# Patient Record
Sex: Female | Born: 1945 | Race: Black or African American | Hispanic: No | Smoking: Never smoker
Health system: Southern US, Community
[De-identification: ages and names within clinical notes are randomized; demographics above are authoritative.]

## PROBLEM LIST (undated history)

## (undated) DIAGNOSIS — M549 Dorsalgia, unspecified: Secondary | ICD-10-CM

## (undated) DIAGNOSIS — I10 Essential (primary) hypertension: Secondary | ICD-10-CM

## (undated) DIAGNOSIS — G8929 Other chronic pain: Secondary | ICD-10-CM

---

## 2011-06-11 ENCOUNTER — Encounter: Payer: Self-pay | Admitting: *Deleted

## 2011-06-11 ENCOUNTER — Emergency Department (HOSPITAL_COMMUNITY): Payer: No Typology Code available for payment source

## 2011-06-11 ENCOUNTER — Emergency Department (HOSPITAL_COMMUNITY)
Admission: EM | Admit: 2011-06-11 | Discharge: 2011-06-11 | Disposition: A | Discharge status: Home / Self Care | Payer: No Typology Code available for payment source | Attending: Emergency Medicine | Admitting: Emergency Medicine

## 2011-06-11 DIAGNOSIS — M545 Low back pain, unspecified: Secondary | ICD-10-CM | POA: Insufficient documentation

## 2011-06-11 DIAGNOSIS — M549 Dorsalgia, unspecified: Secondary | ICD-10-CM

## 2011-06-11 HISTORY — DX: Other chronic pain: G89.29

## 2011-06-11 HISTORY — DX: Essential (primary) hypertension: I10

## 2011-06-11 HISTORY — DX: Dorsalgia, unspecified: M54.9

## 2011-06-11 LAB — CBC
Hemoglobin: 11.6 g/dL — ABNORMAL LOW (ref 12.0–15.0)
Platelets: 259 10*3/uL (ref 150–400)
RBC: 4.21 MIL/uL (ref 3.87–5.11)
WBC: 8.2 10*3/uL (ref 4.0–10.5)

## 2011-06-11 LAB — DIFFERENTIAL
Eosinophils Absolute: 0 10*3/uL (ref 0.0–0.7)
Lymphocytes Relative: 20 % (ref 12–46)
Lymphs Abs: 1.6 10*3/uL (ref 0.7–4.0)
Neutro Abs: 5.8 10*3/uL (ref 1.7–7.7)
Neutrophils Relative %: 71 % (ref 43–77)

## 2011-06-11 LAB — BASIC METABOLIC PANEL
CO2: 26 mEq/L (ref 19–32)
Chloride: 104 mEq/L (ref 96–112)
GFR calc non Af Amer: 89 mL/min — ABNORMAL LOW (ref >90)
Glucose, Bld: 125 mg/dL — ABNORMAL HIGH (ref 70–99)
Potassium: 4.6 mEq/L (ref 3.5–5.1)
Sodium: 140 mEq/L (ref 135–145)

## 2011-06-11 MED ORDER — DIAZEPAM 5 MG PO TABS: 5.0000 mg | ORAL_TABLET | Freq: Four times a day (QID) | ORAL | Status: AC | PRN

## 2011-06-11 MED ORDER — METOCLOPRAMIDE HCL 10 MG PO TABS: 10.0000 mg | ORAL_TABLET | Freq: Four times a day (QID) | ORAL | Status: AC | PRN

## 2011-06-11 MED ORDER — ONDANSETRON 8 MG PO TBDP
8.0000 mg | ORAL_TABLET | Freq: Once | ORAL | Status: AC
  Administered 2011-06-11: 8 mg via ORAL
  Filled 2011-06-11: qty 1

## 2011-06-11 MED ORDER — HYDROMORPHONE HCL 4 MG PO TABS
4.0000 mg | ORAL_TABLET | ORAL | Status: AC | PRN
Start: 2011-06-11 — End: 2011-06-21

## 2011-06-11 MED ORDER — ONDANSETRON HCL 4 MG/2ML IJ SOLN
4.0000 mg | Freq: Once | INTRAMUSCULAR | Status: AC
  Administered 2011-06-11: 4 mg via INTRAVENOUS
  Filled 2011-06-11: qty 2

## 2011-06-11 MED ORDER — DIAZEPAM 5 MG/ML IJ SOLN
5.0000 mg | Freq: Once | INTRAMUSCULAR | Status: AC
  Administered 2011-06-11: 04:00:00 via INTRAMUSCULAR
  Filled 2011-06-11: qty 2

## 2011-06-11 MED ORDER — ONDANSETRON 8 MG PO TBDP: ORAL_TABLET | ORAL | Status: AC

## 2011-06-11 MED ORDER — HYDROMORPHONE HCL PF 1 MG/ML IJ SOLN
1.0000 mg | Freq: Once | INTRAMUSCULAR | Status: AC
  Administered 2011-06-11: 1 mg via INTRAVENOUS
  Filled 2011-06-11: qty 1

## 2011-06-11 NOTE — ED Notes (Signed)
Pt has chronic back spasms, pt presents tonight with c/o same.  Pt states she experiences pain upon sitting or standing.

## 2011-06-11 NOTE — ED Notes (Signed)
EDP in to reassess pt Informed pt of d/c to home.

## 2011-06-11 NOTE — ED Provider Notes (Signed)
History     CSN: [id number]  Arrival date & time 06/11/11  0224   First MD Initiated Contact with Patient 06/11/11 [phone]      Chief Complaint  Patient presents with  . Back Pain     Patient is a 66 y.o. female presenting with back pain. The history is provided by the patient and a relative.  Back Pain  This is a recurrent problem. Episode onset: today. The problem occurs constantly. The problem has been gradually worsening. The pain is associated with no known injury. The pain is present in the lumbar spine. Quality: spasm. The pain does not radiate. The pain is severe. The symptoms are aggravated by bending and twisting. The pain is the same all the time. Pertinent negatives include no fever and no paresis.    Past Medical History  Diagnosis Date  . Chronic back pain greater than 3 months duration   . Hypertension     History reviewed. No pertinent past surgical history.  History reviewed. No pertinent family history.  History  Substance Use Topics  . Smoking status: Not on file  . Smokeless tobacco: Not on file  . Alcohol Use:     OB History    Grav Para Term Preterm Abortions TAB SAB Ect Mult Living                  Review of Systems  Constitutional: Negative for fever.  Musculoskeletal: Positive for back pain.  All other systems reviewed and are negative.    Allergies  Amoxicillin  Home Medications   Current Outpatient Rx  Name Route Sig Dispense Refill  . ASPIRIN-ACETAMINOPHEN-CAFFEINE 250-250-65 MG PO TABS Oral Take 1 tablet by mouth every 6 (six) hours as needed.      . IBUPROFEN 200 MG PO TABS Oral Take 400 mg by mouth every 6 (six) hours as needed. Pain     . LISINOPRIL 10 MG PO TABS Oral Take 10 mg by mouth daily.       BP 146/84  Pulse 70  Temp(Src) 97.6 F (36.4 C) (Oral)  Resp 20  SpO2 99%  Physical Exam  CONSTITUTIONAL: Well developed/well nourished, anxious HEAD AND FACE: Normocephalic/atraumatic EYES: EOMI/PERRL ENMT: Mucous  membranes moist NECK: supple no meningeal signs SPINE:entire spine nontender, lumbar paraspinal tenderness, no bruising/crepitance noted CV: S1/S2 noted, no murmurs/rubs/gallops noted LUNGS: Lungs are clear to auscultation bilaterally, no apparent distress ABDOMEN: soft, nontender, no rebound or guarding GU:no cva tenderness NEURO: Pt is awake/alert, moves all extremitiesx4, no focal motor deficit EXTREMITIES: pulses normal, full ROM SKIN: warm, color normal PSYCH: anxious   ED Course  Procedures    Labs Reviewed  CBC  DIFFERENTIAL  BASIC METABOLIC PANEL  URINALYSIS, ROUTINE W REFLEX MICROSCOPIC   5:46 AM Pain not improving, will give IV meds and check labs Pt stable but still in pain  7:48 AM Pt did have improvement but with movement in bed this re-aggravated her pain No focal motor deficits in the lower extremities No abd pain She reports she had a distant back injury with work and this will recur  MDM  Nursing notes reviewed and considered in documentation All labs/vitals reviewed and considered         Joya Gaskins, MD 06/11/11 [phone]

## 2011-06-11 NOTE — ED Notes (Signed)
Patient transported to X-ray..returned to rm

## 2011-06-11 NOTE — ED Notes (Signed)
[id number]<AV> Expected date:<BR> Expected time:<BR> Means of arrival:<BR> Comments:<BR> EMS/muscle spasms lower back

## 2011-06-11 NOTE — ED Notes (Signed)
Pt requesting more pain meds before labs are drawn. Pt states she is unable to sit down on the bed due to pain

## 2011-06-11 NOTE — ED Provider Notes (Signed)
Patient offered admission for severe pain, but wants to try discharge.[id number]  Melanie Horn, MD 06/15/11 2241

## 2012-10-05 ENCOUNTER — Emergency Department (INDEPENDENT_AMBULATORY_CARE_PROVIDER_SITE_OTHER)
Admission: EM | Admit: 2012-10-05 | Discharge: 2012-10-05 | Disposition: A | Discharge status: Home / Self Care | Payer: Medicare Other | Source: Home / Self Care | Attending: Emergency Medicine | Admitting: Emergency Medicine

## 2012-10-05 ENCOUNTER — Encounter (HOSPITAL_COMMUNITY): Payer: Self-pay

## 2012-10-05 DIAGNOSIS — M5432 Sciatica, left side: Secondary | ICD-10-CM

## 2012-10-05 DIAGNOSIS — IMO0002 Reserved for concepts with insufficient information to code with codable children: Secondary | ICD-10-CM | POA: Diagnosis not present

## 2012-10-05 DIAGNOSIS — M5430 Sciatica, unspecified side: Secondary | ICD-10-CM

## 2012-10-05 DIAGNOSIS — M545 Low back pain: Secondary | ICD-10-CM | POA: Diagnosis not present

## 2012-10-05 MED ORDER — LISINOPRIL 10 MG PO TABS: 10.0000 mg | ORAL_TABLET | Freq: Every day | ORAL | Status: AC

## 2012-10-05 MED ORDER — IBUPROFEN 800 MG PO TABS: 800.0000 mg | ORAL_TABLET | Freq: Three times a day (TID) | ORAL | Status: DC

## 2012-10-05 MED ORDER — CYCLOBENZAPRINE HCL 5 MG PO TABS
5.0000 mg | ORAL_TABLET | Freq: Three times a day (TID) | ORAL | Status: DC | PRN
Start: 2012-10-05 — End: 2013-08-11

## 2012-10-05 MED ORDER — IBUPROFEN 800 MG PO TABS
ORAL_TABLET | ORAL | Status: AC
  Filled 2012-10-05: qty 1

## 2012-10-05 MED ORDER — IBUPROFEN 800 MG PO TABS
800.0000 mg | ORAL_TABLET | Freq: Once | ORAL | Status: AC
  Administered 2012-10-05: 800 mg via ORAL

## 2012-10-05 NOTE — ED Provider Notes (Signed)
Chief Complaint:   Chief Complaint  Patient presents with  . Back Pain    History of Present Illness:   Melanie Henderson is a 67 year old female who recently moved here from [place] and is living with her daughter. In the move she was doing a lot of lifting of furniture and boxes and injured her back. She has similar back pain about a year ago. Right now she describes pain across the entire lower back with radiation down the left leg into the foot with numbness, tingling, and weakness of the left leg. It hurts worse to bend over to lift and is better with rest. The pain is rated 10 over 10 at the worst but now a 6/10. The patient denies any dysuria, frequency, hematuria, incontinence of urine, incontinence of stool, or saddle anesthesia. She's had no fever, chills, unintentional weight loss, headache, stiff neck, or abdominal pain.  Review of Systems:  Other than noted above, the patient denies any of the following symptoms: Systemic:  No fever, chills, severe fatigue, or unexplained weight loss. GI:  No abdominal pain, nausea, vomiting, diarrhea, constipation, incontinence of bowel, or blood in stool. GU:  No dysuria, frequency, urgency, or hematuria. No incontinence of urine or difficulty urinating.  M-S:  No neck pain, joint pain, arthritis, or myalgias. Neuro:  No paresthesias, saddle anesthesia, muscular weakness, or progressive neurological deficit.  PMFSH:  Past medical history, family history, social history, meds, and allergies were reviewed. Specifically, there is no history of cancer, major trauma, osteoporosis, immunosuppression, or HIV infection. She is allergic to amoxicillin. She takes lisinopril 10 mg a day for high blood pressure.  Physical Exam:   Vital signs:  BP 157/80  Pulse 61  Temp(Src) 97.3 F (36.3 C) (Oral)  SpO2 99% General:  Alert, oriented, in no distress. Abdomen:  Soft, non-tender.  No organomegaly or mass.  No pulsatile midline abdominal mass or  bruit. Back:  There is pain to palpation in the lower back the level of the sacroiliac joints bilaterally. The back has a fairly good range of motion with 90 of flexion and 20 of lateral bending in each direction with pain. Straight leg raising is negative. Neuro:  Normal muscle strength, sensations and DTRs. Extremities: Pedal pulses were full, there was no edema. Skin:  Clear, warm and dry.  No rash.  Course in Urgent Care Center:   She was given ibuprofen 800 mg by mouth for pain.  Assessment:  The encounter diagnosis was Sciatica, left.  Plan:   1.  The following meds were prescribed:   Discharge Medication List as of 10/05/2012 11:20 AM    START taking these medications   Details  cyclobenzaprine (FLEXERIL) 5 MG tablet Take 1 tablet (5 mg total) by mouth 3 (three) times daily as needed for muscle spasms., Starting 10/05/2012, Until Discontinued, Normal    !! ibuprofen (ADVIL,MOTRIN) 800 MG tablet Take 1 tablet (800 mg total) by mouth 3 (three) times daily., Starting 10/05/2012, Until Discontinued, Normal     !! - Potential duplicate medications found. Please discuss with provider.     She was also given a prescription for physical therapy. She had an evaluation at hand in rehabilitation and wishes to go there for physical therapy and I told her this didn't work I suggested she see Dr. Dion Saucier in 2 weeks. She'll also need a primary care physician for followup on her blood pressure. I gave her enough lisinopril for a couple of months. I suggested the names  of several primary care practice is that she could followup with.  2.  The patient was instructed in symptomatic care and handouts were given. 3.  The patient was told to return if becoming worse in any way, if no better in 2 weeks, and given some red flag symptoms including increasing pain, fever, or neurological symptoms that would indicate earlier return. 4.  The patient was encouraged to try to be as active as possible and given  some exercises to do followed by moist heat.    Reuben Likes, MD 10/05/12 1256

## 2012-10-05 NOTE — ED Notes (Signed)
C/o pain in back; wants to have PT for her back , and needs a MD order for that

## 2012-10-07 DIAGNOSIS — M545 Low back pain: Secondary | ICD-10-CM | POA: Diagnosis not present

## 2012-10-07 DIAGNOSIS — IMO0002 Reserved for concepts with insufficient information to code with codable children: Secondary | ICD-10-CM | POA: Diagnosis not present

## 2012-10-12 DIAGNOSIS — M545 Low back pain: Secondary | ICD-10-CM | POA: Diagnosis not present

## 2012-10-12 DIAGNOSIS — IMO0002 Reserved for concepts with insufficient information to code with codable children: Secondary | ICD-10-CM | POA: Diagnosis not present

## 2012-10-15 DIAGNOSIS — M545 Low back pain: Secondary | ICD-10-CM | POA: Diagnosis not present

## 2012-10-15 DIAGNOSIS — IMO0002 Reserved for concepts with insufficient information to code with codable children: Secondary | ICD-10-CM | POA: Diagnosis not present

## 2012-12-28 DIAGNOSIS — I10 Essential (primary) hypertension: Secondary | ICD-10-CM | POA: Diagnosis not present

## 2012-12-28 DIAGNOSIS — G44009 Cluster headache syndrome, unspecified, not intractable: Secondary | ICD-10-CM | POA: Diagnosis not present

## 2012-12-30 DIAGNOSIS — Z136 Encounter for screening for cardiovascular disorders: Secondary | ICD-10-CM | POA: Diagnosis not present

## 2012-12-30 DIAGNOSIS — E559 Vitamin D deficiency, unspecified: Secondary | ICD-10-CM | POA: Diagnosis not present

## 2012-12-30 DIAGNOSIS — Z131 Encounter for screening for diabetes mellitus: Secondary | ICD-10-CM | POA: Diagnosis not present

## 2012-12-30 DIAGNOSIS — Z1329 Encounter for screening for other suspected endocrine disorder: Secondary | ICD-10-CM | POA: Diagnosis not present

## 2013-02-10 DIAGNOSIS — H35039 Hypertensive retinopathy, unspecified eye: Secondary | ICD-10-CM | POA: Diagnosis not present

## 2013-02-10 DIAGNOSIS — H40019 Open angle with borderline findings, low risk, unspecified eye: Secondary | ICD-10-CM | POA: Diagnosis not present

## 2013-02-18 DIAGNOSIS — R7309 Other abnormal glucose: Secondary | ICD-10-CM | POA: Diagnosis not present

## 2013-02-18 DIAGNOSIS — I10 Essential (primary) hypertension: Secondary | ICD-10-CM | POA: Diagnosis not present

## 2013-02-19 DIAGNOSIS — Z1231 Encounter for screening mammogram for malignant neoplasm of breast: Secondary | ICD-10-CM | POA: Diagnosis not present

## 2013-02-19 DIAGNOSIS — Z8262 Family history of osteoporosis: Secondary | ICD-10-CM | POA: Diagnosis not present

## 2013-04-02 DIAGNOSIS — Z049 Encounter for examination and observation for unspecified reason: Secondary | ICD-10-CM | POA: Diagnosis not present

## 2013-04-02 DIAGNOSIS — G43719 Chronic migraine without aura, intractable, without status migrainosus: Secondary | ICD-10-CM | POA: Diagnosis not present

## 2013-04-09 DIAGNOSIS — I10 Essential (primary) hypertension: Secondary | ICD-10-CM | POA: Diagnosis not present

## 2013-04-09 DIAGNOSIS — E7800 Pure hypercholesterolemia, unspecified: Secondary | ICD-10-CM | POA: Diagnosis not present

## 2013-04-09 DIAGNOSIS — R7309 Other abnormal glucose: Secondary | ICD-10-CM | POA: Diagnosis not present

## 2013-07-07 DIAGNOSIS — M999 Biomechanical lesion, unspecified: Secondary | ICD-10-CM | POA: Diagnosis not present

## 2013-07-07 DIAGNOSIS — M5430 Sciatica, unspecified side: Secondary | ICD-10-CM | POA: Diagnosis not present

## 2013-07-07 DIAGNOSIS — M5126 Other intervertebral disc displacement, lumbar region: Secondary | ICD-10-CM | POA: Diagnosis not present

## 2013-07-07 DIAGNOSIS — IMO0002 Reserved for concepts with insufficient information to code with codable children: Secondary | ICD-10-CM | POA: Diagnosis not present

## 2013-07-08 DIAGNOSIS — M999 Biomechanical lesion, unspecified: Secondary | ICD-10-CM | POA: Diagnosis not present

## 2013-07-08 DIAGNOSIS — M5430 Sciatica, unspecified side: Secondary | ICD-10-CM | POA: Diagnosis not present

## 2013-07-08 DIAGNOSIS — IMO0002 Reserved for concepts with insufficient information to code with codable children: Secondary | ICD-10-CM | POA: Diagnosis not present

## 2013-07-08 DIAGNOSIS — M5126 Other intervertebral disc displacement, lumbar region: Secondary | ICD-10-CM | POA: Diagnosis not present

## 2013-08-11 ENCOUNTER — Emergency Department (INDEPENDENT_AMBULATORY_CARE_PROVIDER_SITE_OTHER)
Admission: EM | Admit: 2013-08-11 | Discharge: 2013-08-11 | Disposition: A | Discharge status: Home / Self Care | Payer: Medicare Other | Source: Home / Self Care | Attending: Family Medicine | Admitting: Family Medicine

## 2013-08-11 ENCOUNTER — Encounter (HOSPITAL_COMMUNITY): Payer: Self-pay | Admitting: Emergency Medicine

## 2013-08-11 DIAGNOSIS — M5430 Sciatica, unspecified side: Secondary | ICD-10-CM

## 2013-08-11 DIAGNOSIS — M5440 Lumbago with sciatica, unspecified side: Secondary | ICD-10-CM

## 2013-08-11 MED ORDER — CYCLOBENZAPRINE HCL 5 MG PO TABS: 5.0000 mg | ORAL_TABLET | Freq: Every evening | ORAL | Status: DC | PRN

## 2013-08-11 MED ORDER — IBUPROFEN 800 MG PO TABS: 800.0000 mg | ORAL_TABLET | Freq: Three times a day (TID) | ORAL | Status: DC

## 2013-08-11 MED ORDER — PREDNISONE 10 MG PO KIT: PACK | ORAL | Status: DC

## 2013-08-11 NOTE — ED Notes (Signed)
Patient had many questions.  Took questions to dr Denyse Amasscorey for answers.  Patient declined narcotics for pain, secondary to n/v that accompanies narcotic use for her.

## 2013-08-11 NOTE — Discharge Instructions (Signed)
Thank you for coming in today. Take prednisone daily for 12 days Use Flexeril at bedtime as needed Use ibuprofen on prednisone runs out. Do not take with ibuprofen and prednisone at the same time. Come back or go to the emergency room if you notice new weakness new numbness problems walking or bowel or bladder problems. You should hear from physical therapy soon

## 2013-08-11 NOTE — ED Provider Notes (Signed)
Autie Vasudevan is a 68 y.o. female who presents to Urgent Care today for left low back pain radiating to the left leg occasionally present for the last few days. Patient has intermittent numbness. She denies any weakness bowel bladder dysfunction or difficulty walking. She's tried in all muscle relaxer which has not helped much. No fevers or chills nausea vomiting or diarrhea.   Past Medical History  Diagnosis Date  . Chronic back pain greater than 3 months duration   . Hypertension    History  Substance Use Topics  . Smoking status: Never Smoker   . Smokeless tobacco: Not on file  . Alcohol Use: No   ROS as above Medications: No current facility-administered medications for this encounter.   Current Outpatient Prescriptions  Medication Sig Dispense Refill  . aspirin-acetaminophen-caffeine (EXCEDRIN MIGRAINE) 250-250-65 MG per tablet Take 1 tablet by mouth every 6 (six) hours as needed.        . cyclobenzaprine (FLEXERIL) 5 MG tablet Take 1 tablet (5 mg total) by mouth at bedtime as needed for muscle spasms.  20 tablet  0  . ibuprofen (ADVIL,MOTRIN) 200 MG tablet Take 400 mg by mouth every 6 (six) hours as needed. Pain       . ibuprofen (ADVIL,MOTRIN) 800 MG tablet Take 1 tablet (800 mg total) by mouth 3 (three) times daily.  21 tablet  0  . lisinopril (PRINIVIL,ZESTRIL) 10 MG tablet Take 10 mg by mouth daily.       Marland Kitchen lisinopril (PRINIVIL,ZESTRIL) 10 MG tablet Take 1 tablet (10 mg total) by mouth daily.  30 tablet  3  . PredniSONE 10 MG KIT 12 day dose pack po  1 kit  0    Exam:  BP 122/83  Pulse 71  Temp(Src) 98.3 F (36.8 C) (Oral)  Resp 18  SpO2 99% Gen: Well NAD BACK: Nontender to spinal midline. Tender palpation left low back. Positive straight leg raise test. Positive pretzel stretch. Negative Faber test. Sensation is intact throughout. Reflexes are equal bilateral  knees and ankles. Strength is intact throughout bilateral lower extremity Antalgic gait   Assessment  and Plan: 68 y.o. female with lumbago and sciatica. Plan to treat with prednisone most relaxer heating pad at home exercise program. Additionally refer to physical therapy.  Discussed warning signs or symptoms. Please see discharge instructions. Patient expresses understanding.    Gregor Hams, MD 08/11/13 [phone]

## 2013-08-11 NOTE — ED Notes (Signed)
Lower back pain into left leg with intermittent numbness.  History of the same, no known injury.  In process of moving .

## 2013-08-18 ENCOUNTER — Ambulatory Visit: Payer: Medicare Other | Admitting: Physical Therapy

## 2013-08-24 ENCOUNTER — Ambulatory Visit: Payer: Medicare Other | Attending: Family Medicine | Admitting: Physical Therapy

## 2013-08-24 DIAGNOSIS — M25559 Pain in unspecified hip: Secondary | ICD-10-CM | POA: Insufficient documentation

## 2013-08-24 DIAGNOSIS — R5381 Other malaise: Secondary | ICD-10-CM | POA: Diagnosis not present

## 2013-08-24 DIAGNOSIS — IMO0001 Reserved for inherently not codable concepts without codable children: Secondary | ICD-10-CM | POA: Insufficient documentation

## 2013-08-24 DIAGNOSIS — M545 Low back pain, unspecified: Secondary | ICD-10-CM | POA: Insufficient documentation

## 2013-08-24 DIAGNOSIS — R293 Abnormal posture: Secondary | ICD-10-CM | POA: Diagnosis not present

## 2013-08-31 ENCOUNTER — Ambulatory Visit: Payer: Medicare Other | Admitting: Physical Therapy

## 2013-09-02 ENCOUNTER — Ambulatory Visit: Payer: Medicare Other | Admitting: Physical Therapy

## 2013-09-03 DIAGNOSIS — M545 Low back pain, unspecified: Secondary | ICD-10-CM | POA: Diagnosis not present

## 2013-09-03 DIAGNOSIS — I10 Essential (primary) hypertension: Secondary | ICD-10-CM | POA: Diagnosis not present

## 2013-09-07 ENCOUNTER — Ambulatory Visit: Payer: Medicare Other | Admitting: Physical Therapy

## 2013-09-09 ENCOUNTER — Ambulatory Visit: Payer: Medicare Other | Attending: Family Medicine | Admitting: Physical Therapy

## 2013-09-09 DIAGNOSIS — R293 Abnormal posture: Secondary | ICD-10-CM | POA: Insufficient documentation

## 2013-09-09 DIAGNOSIS — M25559 Pain in unspecified hip: Secondary | ICD-10-CM | POA: Insufficient documentation

## 2013-09-09 DIAGNOSIS — IMO0001 Reserved for inherently not codable concepts without codable children: Secondary | ICD-10-CM | POA: Diagnosis not present

## 2013-09-09 DIAGNOSIS — M545 Low back pain, unspecified: Secondary | ICD-10-CM | POA: Insufficient documentation

## 2013-09-09 DIAGNOSIS — R5381 Other malaise: Secondary | ICD-10-CM | POA: Diagnosis not present

## 2013-09-16 ENCOUNTER — Ambulatory Visit: Payer: Medicare Other | Attending: Family Medicine | Admitting: Physical Therapy

## 2013-09-16 DIAGNOSIS — M25559 Pain in unspecified hip: Secondary | ICD-10-CM | POA: Diagnosis not present

## 2013-09-16 DIAGNOSIS — R293 Abnormal posture: Secondary | ICD-10-CM | POA: Insufficient documentation

## 2013-09-16 DIAGNOSIS — M545 Low back pain, unspecified: Secondary | ICD-10-CM | POA: Insufficient documentation

## 2013-09-16 DIAGNOSIS — Z5189 Encounter for other specified aftercare: Secondary | ICD-10-CM | POA: Insufficient documentation

## 2013-09-16 DIAGNOSIS — R5381 Other malaise: Secondary | ICD-10-CM | POA: Diagnosis not present

## 2013-09-20 ENCOUNTER — Ambulatory Visit: Payer: Medicare Other | Admitting: Physical Therapy

## 2013-09-20 DIAGNOSIS — R5381 Other malaise: Secondary | ICD-10-CM | POA: Diagnosis not present

## 2013-09-20 DIAGNOSIS — M25559 Pain in unspecified hip: Secondary | ICD-10-CM | POA: Diagnosis not present

## 2013-09-20 DIAGNOSIS — IMO0001 Reserved for inherently not codable concepts without codable children: Secondary | ICD-10-CM | POA: Diagnosis not present

## 2013-09-20 DIAGNOSIS — M545 Low back pain, unspecified: Secondary | ICD-10-CM | POA: Diagnosis not present

## 2013-09-20 DIAGNOSIS — R293 Abnormal posture: Secondary | ICD-10-CM | POA: Diagnosis not present

## 2013-09-23 ENCOUNTER — Ambulatory Visit: Payer: Medicare Other | Admitting: Physical Therapy

## 2013-09-23 DIAGNOSIS — Z5189 Encounter for other specified aftercare: Secondary | ICD-10-CM | POA: Diagnosis not present

## 2013-09-23 DIAGNOSIS — R5381 Other malaise: Secondary | ICD-10-CM | POA: Diagnosis not present

## 2013-09-23 DIAGNOSIS — M25559 Pain in unspecified hip: Secondary | ICD-10-CM | POA: Diagnosis not present

## 2013-09-23 DIAGNOSIS — R293 Abnormal posture: Secondary | ICD-10-CM | POA: Diagnosis not present

## 2013-09-23 DIAGNOSIS — M545 Low back pain, unspecified: Secondary | ICD-10-CM | POA: Diagnosis not present

## 2013-09-27 ENCOUNTER — Encounter: Payer: Medicare Other | Admitting: Physical Therapy

## 2013-09-30 ENCOUNTER — Ambulatory Visit: Payer: Medicare Other | Admitting: Physical Therapy

## 2013-09-30 DIAGNOSIS — IMO0001 Reserved for inherently not codable concepts without codable children: Secondary | ICD-10-CM | POA: Diagnosis not present

## 2013-09-30 DIAGNOSIS — M545 Low back pain, unspecified: Secondary | ICD-10-CM | POA: Diagnosis not present

## 2013-09-30 DIAGNOSIS — R293 Abnormal posture: Secondary | ICD-10-CM | POA: Diagnosis not present

## 2013-09-30 DIAGNOSIS — R5381 Other malaise: Secondary | ICD-10-CM | POA: Diagnosis not present

## 2013-09-30 DIAGNOSIS — M25559 Pain in unspecified hip: Secondary | ICD-10-CM | POA: Diagnosis not present

## 2013-10-04 ENCOUNTER — Ambulatory Visit: Payer: Medicare Other | Admitting: Physical Therapy

## 2013-10-04 DIAGNOSIS — M25559 Pain in unspecified hip: Secondary | ICD-10-CM | POA: Diagnosis not present

## 2013-10-04 DIAGNOSIS — M545 Low back pain, unspecified: Secondary | ICD-10-CM | POA: Diagnosis not present

## 2013-10-04 DIAGNOSIS — IMO0001 Reserved for inherently not codable concepts without codable children: Secondary | ICD-10-CM | POA: Diagnosis not present

## 2013-10-04 DIAGNOSIS — R5381 Other malaise: Secondary | ICD-10-CM | POA: Diagnosis not present

## 2013-10-04 DIAGNOSIS — R293 Abnormal posture: Secondary | ICD-10-CM | POA: Diagnosis not present

## 2014-05-20 DIAGNOSIS — Z23 Encounter for immunization: Secondary | ICD-10-CM | POA: Diagnosis not present

## 2014-05-20 DIAGNOSIS — K449 Diaphragmatic hernia without obstruction or gangrene: Secondary | ICD-10-CM | POA: Diagnosis not present

## 2014-05-20 DIAGNOSIS — Z0000 Encounter for general adult medical examination without abnormal findings: Secondary | ICD-10-CM | POA: Diagnosis not present

## 2014-05-20 DIAGNOSIS — E559 Vitamin D deficiency, unspecified: Secondary | ICD-10-CM | POA: Diagnosis not present

## 2014-05-20 DIAGNOSIS — D649 Anemia, unspecified: Secondary | ICD-10-CM | POA: Diagnosis not present

## 2014-05-20 DIAGNOSIS — L649 Androgenic alopecia, unspecified: Secondary | ICD-10-CM | POA: Diagnosis not present

## 2014-05-20 DIAGNOSIS — I10 Essential (primary) hypertension: Secondary | ICD-10-CM | POA: Diagnosis not present

## 2014-05-20 DIAGNOSIS — Z79899 Other long term (current) drug therapy: Secondary | ICD-10-CM | POA: Diagnosis not present

## 2014-07-15 DIAGNOSIS — N3000 Acute cystitis without hematuria: Secondary | ICD-10-CM | POA: Diagnosis not present

## 2014-07-15 DIAGNOSIS — R350 Frequency of micturition: Secondary | ICD-10-CM | POA: Diagnosis not present

## 2014-07-20 DIAGNOSIS — Z1231 Encounter for screening mammogram for malignant neoplasm of breast: Secondary | ICD-10-CM | POA: Diagnosis not present

## 2014-08-23 DIAGNOSIS — D509 Iron deficiency anemia, unspecified: Secondary | ICD-10-CM | POA: Diagnosis not present

## 2014-08-23 DIAGNOSIS — R319 Hematuria, unspecified: Secondary | ICD-10-CM | POA: Diagnosis not present

## 2014-10-31 DIAGNOSIS — Z1211 Encounter for screening for malignant neoplasm of colon: Secondary | ICD-10-CM | POA: Diagnosis not present

## 2015-05-16 DIAGNOSIS — N3000 Acute cystitis without hematuria: Secondary | ICD-10-CM | POA: Diagnosis not present

## 2015-06-06 DIAGNOSIS — M1990 Unspecified osteoarthritis, unspecified site: Secondary | ICD-10-CM | POA: Diagnosis not present

## 2015-06-07 ENCOUNTER — Other Ambulatory Visit: Payer: Self-pay | Admitting: Family Medicine

## 2015-06-07 ENCOUNTER — Ambulatory Visit
Admission: RE | Admit: 2015-06-07 | Discharge: 2015-06-07 | Disposition: A | Discharge status: Home / Self Care | Payer: Medicare Other | Source: Ambulatory Visit | Attending: Family Medicine | Admitting: Family Medicine

## 2015-06-07 DIAGNOSIS — M1990 Unspecified osteoarthritis, unspecified site: Secondary | ICD-10-CM

## 2015-06-07 DIAGNOSIS — M25461 Effusion, right knee: Secondary | ICD-10-CM | POA: Diagnosis not present

## 2015-07-27 DIAGNOSIS — Z0000 Encounter for general adult medical examination without abnormal findings: Secondary | ICD-10-CM | POA: Diagnosis not present

## 2015-07-27 DIAGNOSIS — K449 Diaphragmatic hernia without obstruction or gangrene: Secondary | ICD-10-CM | POA: Diagnosis not present

## 2015-07-27 DIAGNOSIS — I10 Essential (primary) hypertension: Secondary | ICD-10-CM | POA: Diagnosis not present

## 2015-07-27 DIAGNOSIS — E559 Vitamin D deficiency, unspecified: Secondary | ICD-10-CM | POA: Diagnosis not present

## 2015-07-27 DIAGNOSIS — L649 Androgenic alopecia, unspecified: Secondary | ICD-10-CM | POA: Diagnosis not present

## 2015-07-27 DIAGNOSIS — Z6833 Body mass index (BMI) 33.0-33.9, adult: Secondary | ICD-10-CM | POA: Diagnosis not present

## 2015-07-27 DIAGNOSIS — D649 Anemia, unspecified: Secondary | ICD-10-CM | POA: Diagnosis not present

## 2015-07-27 DIAGNOSIS — Z23 Encounter for immunization: Secondary | ICD-10-CM | POA: Diagnosis not present

## 2015-08-07 DIAGNOSIS — Z1211 Encounter for screening for malignant neoplasm of colon: Secondary | ICD-10-CM | POA: Diagnosis not present

## 2015-08-23 DIAGNOSIS — Z1231 Encounter for screening mammogram for malignant neoplasm of breast: Secondary | ICD-10-CM | POA: Diagnosis not present

## 2015-09-01 DIAGNOSIS — R922 Inconclusive mammogram: Secondary | ICD-10-CM | POA: Diagnosis not present

## 2015-09-01 DIAGNOSIS — R928 Other abnormal and inconclusive findings on diagnostic imaging of breast: Secondary | ICD-10-CM | POA: Diagnosis not present

## 2015-11-17 DIAGNOSIS — I10 Essential (primary) hypertension: Secondary | ICD-10-CM | POA: Diagnosis not present

## 2015-11-17 DIAGNOSIS — Z6832 Body mass index (BMI) 32.0-32.9, adult: Secondary | ICD-10-CM | POA: Diagnosis not present

## 2015-11-17 DIAGNOSIS — D509 Iron deficiency anemia, unspecified: Secondary | ICD-10-CM | POA: Diagnosis not present

## 2016-04-25 DIAGNOSIS — N6310 Unspecified lump in the right breast, unspecified quadrant: Secondary | ICD-10-CM | POA: Diagnosis not present

## 2016-04-25 DIAGNOSIS — R921 Mammographic calcification found on diagnostic imaging of breast: Secondary | ICD-10-CM | POA: Diagnosis not present

## 2016-04-25 DIAGNOSIS — Z8262 Family history of osteoporosis: Secondary | ICD-10-CM | POA: Diagnosis not present

## 2016-04-25 DIAGNOSIS — M8589 Other specified disorders of bone density and structure, multiple sites: Secondary | ICD-10-CM | POA: Diagnosis not present

## 2016-08-09 DIAGNOSIS — Z0000 Encounter for general adult medical examination without abnormal findings: Secondary | ICD-10-CM | POA: Diagnosis not present

## 2016-08-09 DIAGNOSIS — D509 Iron deficiency anemia, unspecified: Secondary | ICD-10-CM | POA: Diagnosis not present

## 2016-08-09 DIAGNOSIS — Z79899 Other long term (current) drug therapy: Secondary | ICD-10-CM | POA: Diagnosis not present

## 2016-08-09 DIAGNOSIS — I10 Essential (primary) hypertension: Secondary | ICD-10-CM | POA: Diagnosis not present

## 2016-08-09 DIAGNOSIS — Z23 Encounter for immunization: Secondary | ICD-10-CM | POA: Diagnosis not present

## 2016-08-09 DIAGNOSIS — E559 Vitamin D deficiency, unspecified: Secondary | ICD-10-CM | POA: Diagnosis not present

## 2016-08-09 DIAGNOSIS — M8588 Other specified disorders of bone density and structure, other site: Secondary | ICD-10-CM | POA: Diagnosis not present

## 2016-08-20 DIAGNOSIS — Z1211 Encounter for screening for malignant neoplasm of colon: Secondary | ICD-10-CM | POA: Diagnosis not present

## 2017-05-27 DIAGNOSIS — M5416 Radiculopathy, lumbar region: Secondary | ICD-10-CM | POA: Diagnosis not present

## 2017-06-14 DIAGNOSIS — M545 Low back pain: Secondary | ICD-10-CM | POA: Diagnosis not present

## 2017-06-14 DIAGNOSIS — G5701 Lesion of sciatic nerve, right lower limb: Secondary | ICD-10-CM | POA: Diagnosis not present

## 2017-06-14 DIAGNOSIS — G8929 Other chronic pain: Secondary | ICD-10-CM | POA: Diagnosis not present

## 2017-09-09 DIAGNOSIS — Z0000 Encounter for general adult medical examination without abnormal findings: Secondary | ICD-10-CM | POA: Diagnosis not present

## 2017-09-09 DIAGNOSIS — D509 Iron deficiency anemia, unspecified: Secondary | ICD-10-CM | POA: Diagnosis not present

## 2017-09-09 DIAGNOSIS — Z79899 Other long term (current) drug therapy: Secondary | ICD-10-CM | POA: Diagnosis not present

## 2017-09-09 DIAGNOSIS — I10 Essential (primary) hypertension: Secondary | ICD-10-CM | POA: Diagnosis not present

## 2017-09-09 DIAGNOSIS — M25559 Pain in unspecified hip: Secondary | ICD-10-CM | POA: Diagnosis not present

## 2017-09-09 DIAGNOSIS — Z1211 Encounter for screening for malignant neoplasm of colon: Secondary | ICD-10-CM | POA: Diagnosis not present

## 2017-09-09 DIAGNOSIS — E559 Vitamin D deficiency, unspecified: Secondary | ICD-10-CM | POA: Diagnosis not present

## 2017-10-15 DIAGNOSIS — M79605 Pain in left leg: Secondary | ICD-10-CM | POA: Diagnosis not present

## 2017-10-15 DIAGNOSIS — M545 Low back pain: Secondary | ICD-10-CM | POA: Diagnosis not present

## 2017-10-15 DIAGNOSIS — R110 Nausea: Secondary | ICD-10-CM | POA: Diagnosis not present

## 2017-10-21 ENCOUNTER — Ambulatory Visit: Payer: PPO | Attending: Family Medicine | Admitting: Physical Therapy

## 2017-10-21 ENCOUNTER — Other Ambulatory Visit: Payer: Self-pay

## 2017-10-21 ENCOUNTER — Encounter: Payer: Self-pay | Admitting: Physical Therapy

## 2017-10-21 DIAGNOSIS — G8929 Other chronic pain: Secondary | ICD-10-CM | POA: Diagnosis not present

## 2017-10-21 DIAGNOSIS — M6281 Muscle weakness (generalized): Secondary | ICD-10-CM | POA: Diagnosis not present

## 2017-10-21 DIAGNOSIS — M62830 Muscle spasm of back: Secondary | ICD-10-CM

## 2017-10-21 DIAGNOSIS — M545 Low back pain: Secondary | ICD-10-CM | POA: Diagnosis not present

## 2017-10-21 NOTE — Patient Instructions (Signed)
   Copyright  VHI. All rights reserved.   Pelvic Tilt   Flatten back by tightening stomach muscles and buttocks. Repeat __5__ times per set. Do ___1_ sets per session. Do __2__ sessions per day.  http://orth.exer.us/134   Copyright  VHI. All rights reserved. Knee to Chest (Flexion)   Pull knee toward chest. Feel stretch in lower back or buttock area. Breathing deeply, Hold ___5_ seconds. Repeat with other knee. Repeat 3___ times. Do ___1_ sessions per day.  http://gt2.exer.us/225   Copyright  VHI. All rights reserved.   Lower Trunk Rotation Stretch   Keeping back flat and feet together, rotate knees to left side. Hold __5__ seconds. Repeat __3__ times per set. Do __1__ sets per session. Do __2__ sessions per day.  http://orth.exer.us/122   Copyright  VHI. All rights reserved.               TENS UNIT  This is helpful for muscle pain and spasm.   Search and Purchase a TENS 7000 2nd edition at www.tenspros.com or www.amazon.com  (It should be less than $30)     TENS unit instructions:   Do not shower or bathe with the unit on  Turn the unit off before removing electrodes or batteries  If the electrodes lose stickiness add a drop of water to the electrodes after they are disconnected from the unit and place on plastic sheet. If you continued to have difficulty, call the TENS unit company to purchase more electrodes.  Do not apply lotion on the skin area prior to use. Make sure the skin is clean and dry as this will help prolong the life of the electrodes.  After use, always check skin for unusual red areas, rash or other skin difficulties. If there are any skin problems, does not apply electrodes to the same area.  Never remove the electrodes from the unit by pulling the wires.  Do not use the TENS unit or electrodes other than as directed.  Do not change electrode placement without consulting your therapist or physician.  Keep 2 fingers with between  each electrode.     Lavinia Sharps PT [facility] [address], [address] [place], [place] [address] Phone # [phone] Fax [phone]

## 2017-10-21 NOTE — Therapy (Signed)
[facility] [address] [place], [place], [address] Phone: [phone]   Fax:  [phone]  Physical Therapy Evaluation  Patient Details  Name: Melanie Henderson MRN: [id number] Date of Birth: [birth year] Referring Provider: Dr. Farris Has   Encounter Date: 10/21/2017  PT End of Session - 10/21/17 2138    Visit Number  1    Date for PT Re-Evaluation  12/16/17    Authorization Type  Medicare;  KX at visit 15    PT Start Time  0933    PT Stop Time  1015    PT Time Calculation (min)  42 min    Activity Tolerance  Patient tolerated treatment well       Past Medical History:  Diagnosis Date  . Chronic back pain greater than 3 months duration   . Hypertension     History reviewed. No pertinent surgical history.  There were no vitals filed for this visit.   Subjective Assessment - 10/21/17 0935    Subjective  LBP recurrences every 3-4 months.  This flare up 2 weeks with bil LE numbness.  Has been on steroid 3 days.  Only thing that helps.      Pertinent History  injection a couple of months ago    Limitations  House hold activities;Standing    How long can you stand comfortably?  10-15 min    How long can you walk comfortably?  1 mile but not recently b/c of back    Diagnostic tests  x-ray "nothing broken"  "nothing much"    Currently in Pain?  Yes    Pain Score  5     Pain Location  Back    Pain Orientation  Right;Left;Lower    Pain Type  Chronic pain    Pain Onset  1 to 4 weeks ago    Pain Frequency  Constant    Aggravating Factors   lifting; standing 20-30 min; pulling     Pain Relieving Factors  Prednisone; sometimes sitting; nothing really helps it except medicine         [facility] PT Assessment - 10/21/17 0001      Assessment   Medical Diagnosis  low back pain    Referring Provider  Dr. Farris Has    Onset Date/Surgical Date  -- 2 weeks    Next MD Visit  not scheduled    Prior Therapy  3-4 years  ago PT for ES, heat, stretches, exercises to strengthen, adjustments it helped a lot      Precautions   Precautions  None    Precaution Comments  no lifting 10#      Restrictions   Weight Bearing Restrictions  No      Balance Screen   Has the patient fallen in the past 6 months  No    Has the patient had a decrease in activity level because of a fear of falling?   No    Is the patient reluctant to leave their home because of a fear of falling?   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    Leisure  be around family, games on Ipad;  I cook family dinners      Observation/Other Assessments   Focus on Therapeutic Outcomes (FOTO)   47% limitation      Posture/Postural Control   Posture/Postural Control  Postural limitations    Postural Limitations  Decreased lumbar lordosis      AROM   Lumbar Flexion  35    Lumbar Extension  12    Lumbar - Right Side Bend  20    Lumbar - Left Side Bend  30      Strength   Right Hip ABduction  4-/5    Left Hip ABduction  4-/5    Lumbar Flexion  3+/5    Lumbar Extension  3+/5      Flexibility   Soft Tissue Assessment /Muscle Length  yes    Hamstrings  50 degrees bil     Quadriceps  decreased hip flexor muscle lengths bilaterally      Palpation   Palpation comment  numerous tender points and spasm in gluteals and piriformis and bilateral lumbar paraspinals                Objective measurements completed on examination: See above findings.              PT Education - 10/21/17 2138    Education provided  Yes    Education Details  supine basic back ex;  home TENS info     Person(s) Educated  Patient    Methods  Explanation;Handout    Comprehension  Verbalized understanding       PT Short Term Goals - 10/21/17 2148      PT SHORT TERM GOAL #1   Title  The patient will be able to initiate a basic back HEP     Time  4    Period  Weeks    Status  New    Target Date  11/18/17      PT SHORT TERM GOAL #2   Title  The patient will report a 40% improvement in back and LE symptoms with household chores and babysitting her grandchild    Time  4    Period  Weeks    Status  New      PT SHORT TERM GOAL #3   Title  The patient will have improved lumbar ROM:  flexion to 40 degrees, extension 15 degrees and bilateral sidebending to 30 degrees needed for ADLs    Time  4    Period  Weeks    Status  New      PT SHORT TERM GOAL #4   Title  The patient will be able to walk 20-30 min with minimal complaint of pain    Time  4    Period  Weeks    Status  New        PT Long Term Goals - 10/21/17 2153      PT LONG TERM GOAL #1   Title  The patient will be independent in safe self progression of HEP needed to decrease incidence for recurrences    Time  8    Period  Weeks    Status  New    Target Date  12/16/17      PT LONG TERM GOAL #2   Title  The patient will report a 70% improvement in back and LE  symptoms with usual ADLs    Time  8    Period  Weeks    Status  New      PT LONG TERM GOAL #3   Title  Lumbo/pelvic/hip core strength to grossly 4/5 to 4+/5 needed for taking care of her grandchild and standing to cook family dinners.    Time  8    Period  Weeks    Status  New      PT LONG TERM GOAL #4   Title  The patient will have improved lumbar flexion to 50 degrees and HS length to 60 degrees needed for bending/stooping     Time  8    Period  Weeks    Status  New      PT LONG TERM GOAL #5   Title  FOTO functional outcome score improved from 47% limitation to 33% indicating improved function with less pain    Time  8    Period  Weeks     Status  New             Plan - 10/21/17 2134    Clinical Impression Statement  Reoccurences every 3-4 months  with LBP with intense spasms for the past 3 years.  This episode has been present 2 weeks.   Has numbness intermittently in both LEs (one at a time.)  She reports that the only thing that helps with these episodes is a steroid.   She would like to find other means of helping to control the pain.  She reports the pain is much better since she has been on a steroid pack for 3 days but she continues to be very guarded in her movement.  Decreased lumbar ROM in all planes.  Marked tender points and spasm in bil lumbar paraspinals and gluteals.  Decreased HS and hip flexor muscles lengths.  Decreased hip strength and core muscle strength.  She has not been able to do her previous activities including taking care of her grandchild since this back flare up.   She would benefit from PT to address these deficits and to decrease future recurrences.      History and Personal Factors relevant to plan of care:  good home support, minimal co-morbidities    Clinical Presentation  Evolving    Clinical Presentation due to:  worsening of symptoms and now includes radiating symptoms    Clinical Decision Making  Low    Rehab Potential  Good    PT Frequency  2x / week    PT Duration  8 weeks    PT Treatment/Interventions  ADLs/Self Care Home Management;Cryotherapy;Electrical Stimulation;Moist Heat;Traction;Ultrasound;Therapeutic exercise;Therapeutic activities;Neuromuscular re-education;Patient/family education;Manual techniques;Dry needling;Taping;Iontophoresis /ml Dexamethasone    PT Next Visit Plan  ES/heat;  review gentle lumbar mobility ex;  Nu-step;  manual techniques as needed, patient considering DN       Patient will benefit from skilled therapeutic intervention in order to improve the following deficits and impairments:  Pain, Increased fascial restricitons, Increased muscle spasms, Decreased  range of motion, Decreased strength, Impaired perceived functional ability  Visit Diagnosis: Chronic bilateral low back pain, with sciatica presence unspecified - Plan: PT plan of care cert/re-cert  Muscle weakness (generalized) - Plan: PT plan of care cert/re-cert  Muscle spasm of back - Plan: PT plan of care cert/re-cert     Problem List There are no active problems to display for this patient.  Lavinia Sharps, PT 10/21/17 10:01 PM Phone: [phone] Fax: [phone]  Vivien Presto  10/21/2017, 10:01 PM  [facility] [address], [address] [place], [place], [address] Phone: [phone]   Fax:  [phone]  Name: Melanie Henderson MRN: [id number] Date of Birth: August 29, 1945

## 2017-10-24 ENCOUNTER — Encounter: Payer: Self-pay | Admitting: Physical Therapy

## 2017-10-24 ENCOUNTER — Ambulatory Visit: Payer: PPO | Admitting: Physical Therapy

## 2017-10-24 DIAGNOSIS — M545 Low back pain: Secondary | ICD-10-CM | POA: Diagnosis not present

## 2017-10-24 DIAGNOSIS — M62830 Muscle spasm of back: Secondary | ICD-10-CM

## 2017-10-24 DIAGNOSIS — M6281 Muscle weakness (generalized): Secondary | ICD-10-CM

## 2017-10-24 DIAGNOSIS — G8929 Other chronic pain: Secondary | ICD-10-CM

## 2017-10-24 NOTE — Therapy (Signed)
[facility] [address] [place], [place], [address] Phone: [phone]   Fax:  [phone]  Physical Therapy Treatment  Patient Details  Name: Melanie Henderson MRN: [id number] Date of Birth: 16-May-1946 Referring Provider: Dr. Farris Has   Encounter Date: 10/24/2017  PT End of Session - 10/24/17 0853    Visit Number  2    Date for PT Re-Evaluation  12/16/17    Authorization Type  Medicare;  KX at visit 15    PT Start Time  0850    PT Stop Time  0942    PT Time Calculation (min)  52 min    Activity Tolerance  Patient limited by pain    Behavior During Therapy  [facility] for tasks assessed/performed       Past Medical History:  Diagnosis Date  . Chronic back pain greater than 3 months duration   . Hypertension     History reviewed. No pertinent surgical history.  There were no vitals filed for this visit.  Subjective Assessment - 10/24/17 0855    Subjective  I am not sure what I did but the right side has flared up with pain significantly. Laying is very uncomfortable, pt has had to sleep sitting up last 2 nights. She walks very guarded today and presents with constant facial grimacing due to pain.     Currently in Pain?  Yes    Pain Score  10-Worst pain ever    Pain Location  Back    Pain Orientation  Right;Lower    Pain Descriptors / Indicators  Guarding;Stabbing;Sharp;Spasm    Multiple Pain Sites  No                       [facility] Adult PT Treatment/Exercise - 10/24/17 0001      Moist Heat Therapy   Number Minutes Moist Heat  20 Minutes    Moist Heat Location  -- RT lumbar paraspinals to middle of RT glutes, then RT to LT       Electrical Stimulation   Electrical Stimulation Location  -- See above    Electrical Stimulation Action  IFC    Electrical Stimulation Parameters  80-150 HZ to tolerance. Lt sidelying with pillows bt knees    Electrical Stimulation Goals  Pain      Manual Therapy   Manual Therapy  Soft tissue mobilization    Soft tissue mobilization  Lt sidelying, RT paraspinals, lumbar and into RT glutes.               PT Short Term Goals - 10/21/17 2148      PT SHORT TERM GOAL #1   Title  The patient will be able to initiate a basic back HEP     Time  4    Period  Weeks    Status  New    Target Date  11/18/17      PT SHORT TERM GOAL #2   Title  The patient will report a 40% improvement in back and LE symptoms with household chores and babysitting her grandchild    Time  4    Period  Weeks    Status  New      PT SHORT TERM GOAL #3   Title  The patient will have improved lumbar ROM:  flexion to 40 degrees, extension 15 degrees and bilateral sidebending to 30 degrees needed for ADLs    Time  4    Period  Weeks    Status  New      PT SHORT TERM GOAL #4   Title  The patient will be able to walk 20-30 min with minimal complaint of pain    Time  4    Period  Weeks    Status  New        PT Long Term Goals - 10/21/17 2153      PT LONG TERM GOAL #1   Title  The patient will be independent in safe self progression of HEP needed to decrease incidence for recurrences    Time  8    Period  Weeks    Status  New    Target Date  12/16/17      PT LONG TERM GOAL #2   Title  The patient will report a 70% improvement in back and LE symptoms with usual ADLs    Time  8    Period  Weeks    Status  New      PT LONG TERM GOAL #3   Title  Lumbo/pelvic/hip core strength to grossly 4/5 to 4+/5 needed for taking care of her grandchild and standing to cook family dinners.    Time  8    Period  Weeks    Status  New      PT LONG TERM GOAL #4   Title  The patient will have improved lumbar flexion to 50 degrees and HS length to 60 degrees needed for bending/stooping     Time  8    Period  Weeks    Status  New      PT LONG TERM GOAL #5   Title  FOTO functional outcome score improved from 47% limitation to 33% indicating improved function with less pain     Time  8    Period  Weeks    Status  New            Plan - 10/24/17 9604    Clinical Impression Statement  Pt presents today with significant Rt sided low back pain. She had a hard time walking back to the gym, slow, antalgic gait. She could only sit in a chair, could not tolerate laying supine. Sit to stand with increased time and significant guarding due to pain. Pt holding her posture very rigid and straight. She was able to tolerate sidelying Lt with some pillows for soft tissue work. Rt paraspinals in modereate spasm along with tenderness more centrally at the lumbar spine. Estim was tried to give pt some pain relief. Post session pain  was reduced to 6-7 level, better but still present.     Rehab Potential  Good    PT Frequency  2x / week    PT Duration  8 weeks    PT Treatment/Interventions  ADLs/Self Care Home Management;Cryotherapy;Electrical Stimulation;Moist Heat;Traction;Ultrasound;Therapeutic exercise;Therapeutic activities;Neuromuscular re-education;Patient/family education;Manual techniques;Dry needling;Taping;Iontophoresis /ml Dexamethasone    PT Next Visit Plan  Assess pain, see if pt able to perform gentle stretching or mobility exercises. Estim if beneficial.     Consulted and Agree with Plan of Care  Patient       Patient will benefit from skilled therapeutic intervention in order to improve the following deficits and impairments:  Pain, Increased fascial restricitons, Increased muscle spasms, Decreased range of motion, Decreased strength, Impaired perceived functional ability  Visit Diagnosis: Chronic bilateral low back pain, with sciatica presence unspecified  Muscle weakness (generalized)  Muscle spasm of back     Problem List  There are no active problems to display for this patient.   Percy Comp, PTA 10/24/2017, 11:23 AM  [facility] [address], [address] [place], [place], [address] Phone:  [phone]   Fax:  [phone]  Name: Melanie Henderson MRN: [id number] Date of Birth: Oct 11, 1945

## 2017-10-27 ENCOUNTER — Ambulatory Visit: Payer: PPO | Admitting: Physical Therapy

## 2017-10-27 ENCOUNTER — Encounter: Payer: Self-pay | Admitting: Physical Therapy

## 2017-10-27 DIAGNOSIS — M545 Low back pain: Secondary | ICD-10-CM | POA: Diagnosis not present

## 2017-10-27 DIAGNOSIS — G8929 Other chronic pain: Secondary | ICD-10-CM

## 2017-10-27 DIAGNOSIS — M62830 Muscle spasm of back: Secondary | ICD-10-CM

## 2017-10-27 DIAGNOSIS — M6281 Muscle weakness (generalized): Secondary | ICD-10-CM

## 2017-10-27 NOTE — Therapy (Signed)
[facility] [address] [place], [place], [address] Phone: [phone]   Fax:  [phone]  Physical Therapy Treatment  Patient Details  Name: Melanie Henderson MRN: [id number] Date of Birth: 1945/06/25 Referring Provider: Dr. Farris Has   Encounter Date: 10/27/2017  PT End of Session - 10/27/17 0852    Visit Number  3    Date for PT Re-Evaluation  12/16/17    Authorization Type  Medicare;  KX at visit 15    PT Start Time  0851 Pt late, moving slow    PT Stop Time  0935    PT Time Calculation (min)  44 min    Activity Tolerance  Patient limited by pain    Behavior During Therapy  [facility] for tasks assessed/performed       Past Medical History:  Diagnosis Date  . Chronic back pain greater than 3 months duration   . Hypertension     History reviewed. No pertinent surgical history.  There were no vitals filed for this visit.  Subjective Assessment - 10/27/17 0853    Subjective  My right side feels better but it went back to the left. Pt antalgic, slow gait, RT sidebend.    Pertinent History  injection a couple of months ago    Currently in Pain?  Yes    Pain Score  9     Pain Location  Back    Pain Orientation  Left;Lower    Pain Descriptors / Indicators  Guarding;Stabbing;Sharp    Aggravating Factors   Not sure    Pain Relieving Factors  Estim, heat,                        [facility] Adult PT Treatment/Exercise - 10/27/17 0001      Moist Heat Therapy   Number Minutes Moist Heat  15 Minutes    Moist Heat Location  Lumbar Spine      Electrical Stimulation   Electrical Stimulation Location  -- Lumbar Bil    Electrical Stimulation Action  IFC    Electrical Stimulation Parameters  80-150 HZto tolerance    Electrical Stimulation Goals  Pain      Manual Therapy   Manual Therapy  Soft tissue mobilization    Soft tissue mobilization  RT sidelying: Lt lumbar Lt gluteals                 PT Short Term Goals - 10/21/17 2148      PT SHORT TERM GOAL #1   Title  The patient will be able to initiate a basic back HEP     Time  4    Period  Weeks    Status  New    Target Date  11/18/17      PT SHORT TERM GOAL #2   Title  The patient will report a 40% improvement in back and LE symptoms with household chores and babysitting her grandchild    Time  4    Period  Weeks    Status  New      PT SHORT TERM GOAL #3   Title  The patient will have improved lumbar ROM:  flexion to 40 degrees, extension 15 degrees and bilateral sidebending to 30 degrees needed for ADLs    Time  4    Period  Weeks    Status  New      PT SHORT TERM GOAL #4   Title  The patient will be able to walk 20-30 min with minimal complaint of pain    Time  4    Period  Weeks    Status  New        PT Long Term Goals - 10/21/17 2153      PT LONG TERM GOAL #1   Title  The patient will be independent in safe self progression of HEP needed to decrease incidence for recurrences    Time  8    Period  Weeks    Status  New    Target Date  12/16/17      PT LONG TERM GOAL #2   Title  The patient will report a 70% improvement in back and LE symptoms with usual ADLs    Time  8    Period  Weeks    Status  New      PT LONG TERM GOAL #3   Title  Lumbo/pelvic/hip core strength to grossly 4/5 to 4+/5 needed for taking care of her grandchild and standing to cook family dinners.    Time  8    Period  Weeks    Status  New      PT LONG TERM GOAL #4   Title  The patient will have improved lumbar flexion to 50 degrees and HS length to 60 degrees needed for bending/stooping     Time  8    Period  Weeks    Status  New      PT LONG TERM GOAL #5   Title  FOTO functional outcome score improved from 47% limitation to 33% indicating improved function with less pain    Time  8    Period  Weeks    Status  New            Plan - 10/27/17 7829    Clinical Impression Statement  Pt reports  that her Rt side felt better after her last session but 2 days later she flared up along her Lt side and found herslef not being able to lay down or walk much due to the pain.  She again presented with antalgic gait , decreased weight bearing on the LT and slow, guarded movement.  Proximal glutes thickened along with lumbar paraspinals but they were not as involved as the glutes were.  Post session  pain was more 6-7 level. PT was encouraged to alternate between ice and heat at home to her back with her TENS unit she can get from her daughter.     Rehab Potential  Good    PT Frequency  2x / week    PT Duration  8 weeks    PT Treatment/Interventions  ADLs/Self Care Home Management;Cryotherapy;Electrical Stimulation;Moist Heat;Traction;Ultrasound;Therapeutic exercise;Therapeutic activities;Neuromuscular re-education;Patient/family education;Manual techniques;Dry needling;Taping;Iontophoresis /ml Dexamethasone    PT Next Visit Plan  Assess pain, see if pt able to perform gentle stretching or mobility exercises. Estim if beneficial.     Consulted and Agree with Plan of Care  Patient       Patient will benefit from skilled therapeutic intervention in order to improve the following deficits and impairments:  Pain, Increased fascial restricitons, Increased muscle spasms, Decreased range of motion, Decreased strength, Impaired perceived functional ability  Visit Diagnosis: Chronic bilateral low back pain, with sciatica presence unspecified  Muscle weakness (generalized)  Muscle spasm of back     Problem List There are no active problems to display for this patient.   Leighana Neyman, PTA 10/27/2017, 11:01 AM  [facility] [address], [address] [place], [place], [address] Phone: [phone]   Fax:  [phone]  Name: Melanie Henderson MRN: [id number] Date of Birth: 04-Nov-1945

## 2017-10-31 ENCOUNTER — Encounter: Payer: Self-pay | Admitting: Physical Therapy

## 2017-10-31 ENCOUNTER — Ambulatory Visit: Payer: PPO | Admitting: Physical Therapy

## 2017-10-31 DIAGNOSIS — M62830 Muscle spasm of back: Secondary | ICD-10-CM

## 2017-10-31 DIAGNOSIS — M6281 Muscle weakness (generalized): Secondary | ICD-10-CM

## 2017-10-31 DIAGNOSIS — G8929 Other chronic pain: Secondary | ICD-10-CM

## 2017-10-31 DIAGNOSIS — M545 Low back pain: Principal | ICD-10-CM

## 2017-10-31 NOTE — Patient Instructions (Signed)
Side Stretch, Standing Bend OR do this seated is also fine.    Stand, one arm straight over head. Place other hand on hip and bend to that side as far as is comfortable. Hold __10_ seconds. Repeat __3_ times per session. Do _2__ sessions per day. Your goal is to reach up and SLIGHTLY over, whatever feels like a good stretch to the side of your body. Become taller!! Not smaller, watch your slouching.   * Use a pillow to support when you sit OFTEN!!!   Isometric Abdominal Contraction    Tuck in stomach muscles and push low back against back of chair. Hold _5___ seconds while counting out loud. Repeat ____6 times. Do _5___ sessions per day. Remember to sit tall when you do this. FEEL your stomach muscles "hug" your spine.   http://gt2.exer.us/624   Copyright  VHI. All rights reserved.    Copyright  VHI. All rights reserved.

## 2017-10-31 NOTE — Therapy (Signed)
[facility] [address], [address] [place], [place], [address] Phone: [phone]   Fax:  [phone]  Physical Therapy Treatment  Patient Details  Name: Melanie Henderson MRN: [id number] Date of Birth: 1946/05/01 Referring Provider: Dr. Farris Has   Encounter Date: 10/31/2017  PT End of Session - 10/31/17 0840    Visit Number  4    Date for PT Re-Evaluation  12/16/17    Authorization Type  Medicare;  KX at visit 15    PT Start Time  0840    PT Stop Time  0930    PT Time Calculation (min)  50 min    Activity Tolerance  Patient tolerated treatment well    Behavior During Therapy  [facility] for tasks assessed/performed       Past Medical History:  Diagnosis Date  . Chronic back pain greater than 3 months duration   . Hypertension     History reviewed. No pertinent surgical history.  There were no vitals filed for this visit.  Subjective Assessment - 10/31/17 0841    Subjective  I am doing better. Pt ambulated into clinic with less antalgia and more equal weightbearing in her legs.     Pertinent History  injection a couple of months ago    Limitations  House hold activities;Standing    How long can you stand comfortably?  10-15 min    Currently in Pain?  Yes    Pain Score  6     Pain Location  Back    Pain Orientation  Right;Left;Lower    Pain Descriptors / Indicators  Sore;Tender    Aggravating Factors   Overdoing it/too much activity    Pain Relieving Factors  Estim, heat    Multiple Pain Sites  No                       [facility] Adult PT Treatment/Exercise - 10/31/17 0001      Lumbar Exercises: Aerobic   Nustep  L1 5 min PTA present for montioring pain      Lumbar Exercises: Seated   Other Seated Lumbar Exercises  TA contraction seated 6x Instructional cues from PTA      Moist Heat Therapy   Number Minutes Moist Heat  15 Minutes    Moist Heat Location  Lumbar Spine      Electrical Stimulation   Electrical Stimulation Location  -- Lumbar Bil    Electrical Stimulation Action  IFC    Electrical Stimulation Parameters  80-150 HZ to tolerance    Electrical Stimulation Goals  Pain      Manual Therapy   Manual Therapy  Soft tissue mobilization    Soft tissue mobilization  RT sidelying: Lt lumbar Lt gluteals              PT Education - 10/31/17 0914    Education provided  Yes    Education Details  HEP    Person(s) Educated  Patient    Methods  Explanation;Demonstration;Verbal cues;Handout    Comprehension  Verbalized understanding;Returned demonstration       PT Short Term Goals - 10/31/17 0920      PT SHORT TERM GOAL #1   Title  The patient will be able to initiate a basic back HEP     Time  4    Period  Weeks    Status  On-going Reports has been unable to do bc of pain. Will try more now that  she feels better.       PT SHORT TERM GOAL #2   Title  The patient will report a 40% improvement in back and LE symptoms with household chores and babysitting her grandchild    Time  4    Period  Weeks    Status  On-going Maybe 10% from last time/week.       PT SHORT TERM GOAL #4   Title  The patient will be able to walk 20-30 min with minimal complaint of pain    Period  Weeks    Status  On-going 10 min        PT Long Term Goals - 10/21/17 2153      PT LONG TERM GOAL #1   Title  The patient will be independent in safe self progression of HEP needed to decrease incidence for recurrences    Time  8    Period  Weeks    Status  New    Target Date  12/16/17      PT LONG TERM GOAL #2   Title  The patient will report a 70% improvement in back and LE symptoms with usual ADLs    Time  8    Period  Weeks    Status  New      PT LONG TERM GOAL #3   Title  Lumbo/pelvic/hip core strength to grossly 4/5 to 4+/5 needed for taking care of her grandchild and standing to cook family dinners.    Time  8    Period  Weeks    Status  New      PT LONG TERM GOAL #4   Title  The  patient will have improved lumbar flexion to 50 degrees and HS length to 60 degrees needed for bending/stooping     Time  8    Period  Weeks    Status  New      PT LONG TERM GOAL #5   Title  FOTO functional outcome score improved from 47% limitation to 33% indicating improved function with less pain    Time  8    Period  Weeks    Status  New            Plan - 10/31/17 0840    Clinical Impression Statement  Pt presentds to PT today with much less pain, improved walking and posture. She has been using her daughters TENS unit at home and this has helped a lot per pt. She was able to tolerate a  low level  initial  exercises today which were given for HEP. Pt sill does not tolerate supine position very well, she prefers seated exercises.  Soft tissues continue to relax, staying more supple more often.     Rehab Potential  Good    PT Frequency  2x / week    PT Duration  8 weeks    PT Treatment/Interventions  ADLs/Self Care Home Management;Cryotherapy;Electrical Stimulation;Moist Heat;Traction;Ultrasound;Therapeutic exercise;Therapeutic activities;Neuromuscular re-education;Patient/family education;Manual techniques;Dry needling;Taping;Iontophoresis /ml Dexamethasone    PT Next Visit Plan  Pt has her granddaughter grad and travel for family reunion coming up so she is unsure of when she can come back for her next visit. Probably need FOTO, and mini assessment on the next visit.     Consulted and Agree with Plan of Care  Patient       Patient will benefit from skilled therapeutic intervention in order to improve the following deficits and impairments:  Pain, Increased fascial restricitons, Increased  muscle spasms, Decreased range of motion, Decreased strength, Impaired perceived functional ability  Visit Diagnosis: Chronic bilateral low back pain, with sciatica presence unspecified  Muscle weakness (generalized)  Muscle spasm of back     Problem List There are no active problems  to display for this patient.   Princess Karnes, PTA 10/31/2017, 9:25 AM  [facility] [address], [address] [place], [place], [address] Phone: [phone]   Fax:  [phone]  Name: Raivyn Kabler MRN: [id number] Date of Birth: Apr 07, 1946

## 2017-11-11 ENCOUNTER — Emergency Department (HOSPITAL_COMMUNITY)
Admission: EM | Admit: 2017-11-11 | Discharge: 2017-11-11 | Disposition: A | Discharge status: Home / Self Care | Payer: PPO | Attending: Emergency Medicine | Admitting: Emergency Medicine

## 2017-11-11 ENCOUNTER — Encounter (HOSPITAL_COMMUNITY): Payer: Self-pay | Admitting: Emergency Medicine

## 2017-11-11 ENCOUNTER — Emergency Department (HOSPITAL_COMMUNITY): Payer: PPO

## 2017-11-11 DIAGNOSIS — M545 Low back pain: Secondary | ICD-10-CM | POA: Insufficient documentation

## 2017-11-11 DIAGNOSIS — Z79899 Other long term (current) drug therapy: Secondary | ICD-10-CM | POA: Diagnosis not present

## 2017-11-11 DIAGNOSIS — M5441 Lumbago with sciatica, right side: Secondary | ICD-10-CM

## 2017-11-11 DIAGNOSIS — Z7982 Long term (current) use of aspirin: Secondary | ICD-10-CM | POA: Insufficient documentation

## 2017-11-11 DIAGNOSIS — M5442 Lumbago with sciatica, left side: Secondary | ICD-10-CM | POA: Diagnosis not present

## 2017-11-11 DIAGNOSIS — I10 Essential (primary) hypertension: Secondary | ICD-10-CM | POA: Insufficient documentation

## 2017-11-11 MED ORDER — KETOROLAC TROMETHAMINE 30 MG/ML IJ SOLN
15.0000 mg | Freq: Once | INTRAMUSCULAR | Status: AC
  Administered 2017-11-11: 15 mg via INTRAVENOUS
  Filled 2017-11-11: qty 1

## 2017-11-11 MED ORDER — HYDROMORPHONE HCL 2 MG/ML IJ SOLN
0.5000 mg | Freq: Once | INTRAMUSCULAR | Status: AC
  Administered 2017-11-11: 0.5 mg via INTRAVENOUS
  Filled 2017-11-11: qty 1

## 2017-11-11 MED ORDER — METHOCARBAMOL 500 MG PO TABS: 500.0000 mg | ORAL_TABLET | Freq: Three times a day (TID) | ORAL | 0 refills | Status: DC | PRN

## 2017-11-11 MED ORDER — ONDANSETRON 4 MG PO TBDP
4.0000 mg | ORAL_TABLET | Freq: Once | ORAL | Status: AC
Start: 2017-11-11 — End: 2017-11-11
  Administered 2017-11-11: 4 mg via ORAL
  Filled 2017-11-11: qty 1

## 2017-11-11 MED ORDER — DIAZEPAM 2 MG PO TABS
2.0000 mg | ORAL_TABLET | Freq: Once | ORAL | Status: AC
  Administered 2017-11-11: 2 mg via ORAL
  Filled 2017-11-11: qty 1

## 2017-11-11 MED ORDER — ONDANSETRON 4 MG PO TBDP: 4.0000 mg | ORAL_TABLET | Freq: Three times a day (TID) | ORAL | 0 refills | Status: DC | PRN

## 2017-11-11 MED ORDER — PREDNISONE 20 MG PO TABS: 40.0000 mg | ORAL_TABLET | Freq: Every day | ORAL | 0 refills | Status: DC

## 2017-11-11 MED ORDER — ONDANSETRON HCL 4 MG/2ML IJ SOLN
4.0000 mg | Freq: Once | INTRAMUSCULAR | Status: AC
  Administered 2017-11-11: 4 mg via INTRAVENOUS
  Filled 2017-11-11: qty 2

## 2017-11-11 NOTE — ED Provider Notes (Signed)
Patient placed in Quick Look pathway, seen and evaluated   Chief Complaint: low back pain  HPI:   Melanie Henderson is a 72 y.o. female who presents to the ED with left side low back pain. Patient reports she has been having problems for over three months and has been to her PCP, Urgent Care and has been in rehab for the problem. She has muscle relaxer and has been taking it but it does not help. Last night the pain became severe with spasm and has worsened today.   ROS: M/S: left side back pain and spasm  Physical Exam:  BP 113/78   Pulse 85   Temp 98.5 F (36.9 C) (Oral)   Resp 16   SpO2 100%   Gen: No distress appears uncomfortable  Neuro: Awake and Alert  Skin: Warm and dry  M/S: patient unable to tolerate exam due to pain.       Initiation of care has begun. The patient has been counseled on the process, plan, and necessity for staying for the completion/evaluation, and the remainder of the medical screening examination    Janne Napoleoneese, Hope M, NP 11/11/17 1335    Gerhard MunchLockwood, Robert, MD 11/14/17 2340

## 2017-11-11 NOTE — ED Triage Notes (Signed)
Hip and b ack spasms  Since last night  Comes and goes  X 1 month has seen a dr and given rx but no better

## 2017-11-11 NOTE — ED Notes (Signed)
Patient transported to X-ray 

## 2017-11-11 NOTE — ED Provider Notes (Signed)
[facility] Provider Note   CSN: [id number] Arrival date & time: 11/11/17  1301     History   Chief Complaint No chief complaint on file.   HPI Melanie Henderson is a 72 y.o. female.  HPI Patient presents with low back pain.  Has had for years but worse over the last few months but specifically worse over the last few days.  Last night became much worse and had spasm.  States she has been seen by urgent care and has had physical therapy for it..No loss of bladder or bowel control.  Has had steroids at times with some relief.  No fevers.+  Pain is in her lower back and will radiate to either leg. Past Medical History:  Diagnosis Date  . Chronic back pain greater than 3 months duration   . Hypertension     There are no active problems to display for this patient.   History reviewed. No pertinent surgical history.   OB History   None      Home Medications    Prior to Admission medications   Medication Sig Start Date End Date Taking? Authorizing Provider  acetaminophen-codeine (TYLENOL #4) 300-60 MG tablet Take 1 tablet by mouth every 6 (six) hours as needed for pain. 09/30/17  Yes [provider]  aspirin-acetaminophen-caffeine (EXCEDRIN MIGRAINE) [id number] MG per tablet Take 1 tablet by mouth every 6 (six) hours as needed.     Yes [provider]  Cholecalciferol (VITAMIN D) 2000 units CAPS Take 2,000 Units by mouth daily.   Yes [provider]  Cranberry 500 MG TABS Take 500 mg by mouth daily.   Yes [provider]  hydroxypropyl methylcellulose / hypromellose (ISOPTO TEARS / GONIOVISC) 2.5 % ophthalmic solution Place 1 drop into both eyes as needed for dry eyes.   Yes [provider]  ibuprofen (ADVIL,MOTRIN) 200 MG tablet Take 400 mg by mouth every 6 (six) hours as needed. Pain    Yes [provider]  lisinopril (PRINIVIL,ZESTRIL) 20 MG tablet Take 20 mg by mouth daily. 10/10/17  Yes  [provider]  Omeprazole 20 MG TBEC Take 20 tablets by mouth daily. 10/15/17  Yes [provider]  ondansetron (ZOFRAN) 8 MG tablet Take 8 mg by mouth every 8 (eight) hours as needed. 10/15/17  Yes [provider]  Pediatric Multiple Vitamins (MULTIVITAMIN) LIQD Take 5 mLs by mouth daily.   Yes [provider]  trolamine salicylate (ASPERCREME) 10 % cream Apply 1 application topically as needed for muscle pain.   Yes [provider]  cyclobenzaprine (FLEXERIL) 5 MG tablet Take 1 tablet (5 mg total) by mouth at bedtime as needed for muscle spasms. Patient not taking: Reported on 11/11/2017 08/11/13   Rodolph Bongorey, Evan S, MD  lisinopril (PRINIVIL,ZESTRIL) 10 MG tablet Take 1 tablet (10 mg total) by mouth daily. Patient not taking: Reported on 11/11/2017 10/05/12   Reuben LikesKeller, David C, MD  methocarbamol (ROBAXIN) 500 MG tablet Take 1 tablet (500 mg total) by mouth every 8 (eight) hours as needed for muscle spasms. 11/11/17   Benjiman CorePickering, Silvino Selman, MD  predniSONE (DELTASONE) 20 MG tablet Take 2 tablets (40 mg total) by mouth daily. 11/11/17   Benjiman CorePickering, Bode Pieper, MD    Family History No family history on file.  Social History Social History   Tobacco Use  . Smoking status: Never Smoker  . Smokeless tobacco: Never Used  Substance Use Topics  . Alcohol use: No  . Drug use:  No     Allergies   Amoxicillin   Review of Systems Review of Systems  Constitutional: Negative for appetite change.  HENT: Negative for congestion.   Respiratory: Negative for shortness of breath.   Cardiovascular: Negative for chest pain.  Gastrointestinal: Negative for abdominal pain.  Genitourinary: Negative for flank pain.  Musculoskeletal: Positive for back pain and gait problem. Negative for neck pain.  Skin: Negative for pallor.  Neurological: Negative for numbness.  Hematological: Negative for adenopathy.  Psychiatric/Behavioral: Negative for confusion.     Physical Exam Updated  Vital Signs BP 128/69   Pulse 71   Temp 98 F (36.7 C) (Oral)   Resp 18   SpO2 100%   Physical Exam  Constitutional: She appears well-developed.  She will treat her daughter.  Patient is sitting in a bedside wheelchair and appears uncomfortable  HENT:  Head: Normocephalic.  Eyes: EOM are normal.  Neck: Neck supple.  Cardiovascular: Normal rate.  Abdominal: There is no tenderness.  Musculoskeletal: She exhibits tenderness.  Initial severe tenderness over lumbar spine.  Pain with movement of either lower extremity.  After pain medicine patient feels better.  No midline tenderness.  Has flexion extension and bilateral ankles.  States she thinks the difficulty moving is due to the pain.  Perineal sensation intact.  Neurological: She is alert.  Skin: Skin is warm. Capillary refill takes less than 2 seconds.     ED Treatments / Results  Labs (all labs ordered are listed, but only abnormal results are displayed) Labs Reviewed - No data to display  EKG None  Radiology Dg Lumbar Spine Complete  Result Date: 11/11/2017 CLINICAL DATA:  72 year old female with low back pain. EXAM: LUMBAR SPINE - COMPLETE 4+ VIEW COMPARISON:  None. FINDINGS: No acute fracture or subluxation. Disc spaces are maintained. Mild facet arthropathy in the LOWER lumbar spine identified. No acute bony abnormalities or spondylolysis. IMPRESSION: 1. No acute abnormality 2. Mild facet arthropathy in the LOWER lumbar spine. Electronically Signed   By: Harmon Pier M.D.   On: 11/11/2017 18:46    Procedures Procedures (including critical care time)  Medications Ordered in ED Medications  ondansetron (ZOFRAN-ODT) disintegrating tablet 4 mg (4 mg Oral Given 11/11/17 1342)  diazepam (VALIUM) tablet 2 mg (2 mg Oral Given 11/11/17 1342)  ketorolac (TORADOL) 30 MG/ML injection 15 mg (15 mg Intravenous Given 11/11/17 1658)  HYDROmorphone (DILAUDID) injection 0.5 mg (0.5 mg Intravenous Given 11/11/17 1659)  ondansetron (ZOFRAN)  injection 4 mg (4 mg Intravenous Given 11/11/17 1853)     Initial Impression / Assessment and Plan / ED Course  I have reviewed the triage vital signs and the nursing notes.  Pertinent labs & imaging results that were available during my care of the patient were reviewed by me and considered in my medical decision making (see chart for details).     Patient with acute on chronic low back pain.  No injury.  X-ray done and reassuring.  No red flags.  Feels better after treatment.  Will have follow-up with neurosurgery.  Given steroids and muscle relaxers.  Final Clinical Impressions(s) / ED Diagnoses   Final diagnoses:  Acute midline low back pain with bilateral sciatica    ED Discharge Orders        Ordered    predniSONE (DELTASONE) 20 MG tablet  Daily     11/11/17 1938    methocarbamol (ROBAXIN) 500 MG tablet  Every 8 hours PRN     11/11/17 1938  Benjiman Core, MD 11/11/17 1946

## 2017-11-11 NOTE — ED Notes (Signed)
RN and Tech attempted to get patient out of her clothes and placed into a gown. Pt eventually stated she did not want to change right now. RN stated that was fine but might delay the providers exam once they arrive.

## 2017-11-12 ENCOUNTER — Encounter: Payer: Medicare Other | Admitting: Physical Therapy

## 2017-11-17 ENCOUNTER — Ambulatory Visit: Payer: PPO | Attending: Family Medicine | Admitting: Physical Therapy

## 2017-11-17 ENCOUNTER — Encounter: Payer: Self-pay | Admitting: Physical Therapy

## 2017-11-17 DIAGNOSIS — M6281 Muscle weakness (generalized): Secondary | ICD-10-CM | POA: Diagnosis not present

## 2017-11-17 DIAGNOSIS — M545 Low back pain: Secondary | ICD-10-CM | POA: Insufficient documentation

## 2017-11-17 DIAGNOSIS — M62830 Muscle spasm of back: Secondary | ICD-10-CM | POA: Diagnosis not present

## 2017-11-17 DIAGNOSIS — G8929 Other chronic pain: Secondary | ICD-10-CM | POA: Insufficient documentation

## 2017-11-17 NOTE — Therapy (Addendum)
[facility] [address] [place], [place], [address] Phone: [phone]   Fax:  [phone]  Physical Therapy Treatment/Discharge Summary  Patient Details  Name: Melanie Henderson MRN: [id number] Date of Birth: Apr 26, 1946 Referring Provider: Dr. London Pepper   Encounter Date: 11/17/2017  PT End of Session - 11/17/17 1502    Visit Number  5    Date for PT Re-Evaluation  12/16/17    Authorization Type  Medicare;  KX at visit 15    PT Start Time  6222    PT Stop Time  1520    PT Time Calculation (min)  35 min    Activity Tolerance  Patient limited by pain       Past Medical History:  Diagnosis Date  . Chronic back pain greater than 3 months duration   . Hypertension     History reviewed. No pertinent surgical history.  There were no vitals filed for this visit.  Subjective Assessment - 11/17/17 1504    Subjective  I was not able to go to the graduation due to my back pain. I went to the ED due to pain. I was doing better, then I woke up last night/this AM with bad spasms in the right side of my back.  Pt again is walking very slowly, took extra time just to get on the treatment table because of her pain.  She has to wait to see the neuro MD until she completes an MRI through her family MD which is not unitl July.     Pertinent History  injection a couple of months ago    Limitations  House hold activities;Standing    Currently in Pain?  Yes    Pain Score  9     Pain Location  Back    Pain Orientation  Right;Lower    Pain Descriptors / Indicators  Spasm;Sharp    Aggravating Factors   She is not sure what brings it on    Pain Relieving Factors  estim, heat    Multiple Pain Sites  No                       [facility] Adult PT Treatment/Exercise - 11/17/17 0001      Electrical Stimulation   Electrical Stimulation Location  lumbar, Rt gluteals    Electrical Stimulation Action  IFC    Electrical  Stimulation Parameters  80-150 HZ    Electrical Stimulation Goals  Pain      Manual Therapy   Manual Therapy  Soft tissue mobilization    Soft tissue mobilization  RT sidelying: Lt lumbar Lt gluteals  Very tender in her Rt gluteals               PT Short Term Goals - 10/31/17 0920      PT SHORT TERM GOAL #1   Title  The patient will be able to initiate a basic back HEP     Time  4    Period  Weeks    Status  On-going Reports has been unable to do bc of pain. Will try more now that she feels better.       PT SHORT TERM GOAL #2   Title  The patient will report a 40% improvement in back and LE symptoms with household chores and babysitting her grandchild    Time  4    Period  Weeks    Status  On-going Maybe 10%  from last time/week.       PT SHORT TERM GOAL #4   Title  The patient will be able to walk 20-30 min with minimal complaint of pain    Period  Weeks    Status  On-going 10 min        PT Long Term Goals - 10/21/17 2153      PT LONG TERM GOAL #1   Title  The patient will be independent in safe self progression of HEP needed to decrease incidence for recurrences    Time  8    Period  Weeks    Status  New    Target Date  12/16/17      PT LONG TERM GOAL #2   Title  The patient will report a 70% improvement in back and LE symptoms with usual ADLs    Time  8    Period  Weeks    Status  New      PT LONG TERM GOAL #3   Title  Lumbo/pelvic/hip core strength to grossly 4/5 to 4+/5 needed for taking care of her grandchild and standing to cook family dinners.    Time  8    Period  Weeks    Status  New      PT LONG TERM GOAL #4   Title  The patient will have improved lumbar flexion to 50 degrees and HS length to 60 degrees needed for bending/stooping     Time  8    Period  Weeks    Status  New      PT LONG TERM GOAL #5   Title  FOTO functional outcome score improved from 47% limitation to 33% indicating improved function with less pain    Time  8    Period   Weeks    Status  New            Plan - 11/17/17 1503    Clinical Impression Statement  Pt presents today with another flare up of back pain and muscle spasms in her Rt low back. All movements were slow, cautious, and guarded. During manual soft tissue work, there was one spot around the lateral aspect of her gluteals where she screamed out in pain. She became even more guarded after this and Estim was then applied. Pt could not tolerate supine. She has been referred to see Neurologist but needs to have MRI completed before they will see her.  There is no progress towards goals at this time as pain has been the main limiting factor.     Rehab Potential  Good    PT Frequency  2x / week    PT Duration  8 weeks    PT Treatment/Interventions  ADLs/Self Care Home Management;Cryotherapy;Electrical Stimulation;Moist Heat;Traction;Ultrasound;Therapeutic exercise;Therapeutic activities;Neuromuscular re-education;Patient/family education;Manual techniques;Dry needling;Taping;Iontophoresis 4m/ml Dexamethasone    PT Next Visit Plan  ERO next. Decide whether to continue or DC until she sees MD    Consulted and Agree with Plan of Care  Patient       Patient will benefit from skilled therapeutic intervention in order to improve the following deficits and impairments:  Pain, Increased fascial restricitons, Increased muscle spasms, Decreased range of motion, Decreased strength, Impaired perceived functional ability  Visit Diagnosis: Chronic bilateral low back pain, with sciatica presence unspecified  Muscle weakness (generalized)  Muscle spasm of back  PHYSICAL THERAPY DISCHARGE SUMMARY  Visits from Start of Care: 5  Current functional level related to goals / functional outcomes: See  clinical impression statement above.  She has not returned to PT since this last visit.     Remaining deficits: As above   Education / Equipment: Basic HEP and self care Plan:                                                     Patient goals were not met. Patient is being discharged due to not returning since the last visit.  ?????          Problem List There are no active problems to display for this patient.  Ruben Im, PT 03/05/18 12:11 PM Phone: [phone] Fax: [phone]      Fathima Bartl, PTA 11/17/2017, 3:14 PM  [facility] [address], [place], [place], [address] Phone: [phone]   Fax:  [phone]  Name: Virgie Kunda MRN: [id number] Date of Birth: 03-13-1946

## 2017-11-20 ENCOUNTER — Other Ambulatory Visit: Payer: Self-pay | Admitting: Family Medicine

## 2017-11-20 DIAGNOSIS — M545 Low back pain, unspecified: Secondary | ICD-10-CM

## 2017-11-20 DIAGNOSIS — R200 Anesthesia of skin: Secondary | ICD-10-CM

## 2017-11-23 ENCOUNTER — Ambulatory Visit
Admission: RE | Admit: 2017-11-23 | Discharge: 2017-11-23 | Disposition: A | Discharge status: Home / Self Care | Payer: PPO | Source: Ambulatory Visit | Attending: Family Medicine | Admitting: Family Medicine

## 2017-11-23 DIAGNOSIS — R200 Anesthesia of skin: Secondary | ICD-10-CM

## 2017-11-23 DIAGNOSIS — M545 Low back pain, unspecified: Secondary | ICD-10-CM

## 2017-11-28 ENCOUNTER — Ambulatory Visit: Payer: Medicare Other | Admitting: Physical Therapy

## 2017-11-29 DIAGNOSIS — M545 Low back pain: Secondary | ICD-10-CM | POA: Diagnosis not present

## 2017-12-08 DIAGNOSIS — M519 Unspecified thoracic, thoracolumbar and lumbosacral intervertebral disc disorder: Secondary | ICD-10-CM | POA: Diagnosis not present

## 2017-12-08 DIAGNOSIS — K649 Unspecified hemorrhoids: Secondary | ICD-10-CM | POA: Diagnosis not present

## 2017-12-09 DIAGNOSIS — M5440 Lumbago with sciatica, unspecified side: Secondary | ICD-10-CM | POA: Diagnosis not present

## 2017-12-23 ENCOUNTER — Emergency Department (HOSPITAL_COMMUNITY)
Admission: EM | Admit: 2017-12-23 | Discharge: 2017-12-23 | Disposition: A | Discharge status: Home / Self Care | Payer: PPO | Attending: Emergency Medicine | Admitting: Emergency Medicine

## 2017-12-23 DIAGNOSIS — M5432 Sciatica, left side: Secondary | ICD-10-CM | POA: Diagnosis not present

## 2017-12-23 DIAGNOSIS — I10 Essential (primary) hypertension: Secondary | ICD-10-CM | POA: Insufficient documentation

## 2017-12-23 DIAGNOSIS — Z79899 Other long term (current) drug therapy: Secondary | ICD-10-CM | POA: Diagnosis not present

## 2017-12-23 DIAGNOSIS — M545 Low back pain: Secondary | ICD-10-CM | POA: Diagnosis present

## 2017-12-23 LAB — BASIC METABOLIC PANEL
Anion gap: 9 (ref 5–15)
BUN: 16 mg/dL (ref 8–23)
CO2: 26 mmol/L (ref 22–32)
Calcium: 9.5 mg/dL (ref 8.9–10.3)
Chloride: 105 mmol/L (ref 98–111)
Creatinine, Ser: 0.55 mg/dL (ref 0.44–1.00)
GFR calc Af Amer: >60 mL/min (ref >60)
GFR calc non Af Amer: >60 mL/min (ref >60)
Glucose, Bld: 116 mg/dL — ABNORMAL HIGH (ref 70–99)
Potassium: 4.3 mmol/L (ref 3.5–5.1)
Sodium: 140 mmol/L (ref 135–145)

## 2017-12-23 LAB — CBC WITH DIFFERENTIAL/PLATELET
Abs Immature Granulocytes: 0 10*3/uL (ref 0.0–0.1)
Basophils Absolute: 0 10*3/uL (ref 0.0–0.1)
Basophils Relative: 0 %
Eosinophils Absolute: 0 10*3/uL (ref 0.0–0.7)
Eosinophils Relative: 0 %
HCT: 31.2 % — ABNORMAL LOW (ref 36.0–46.0)
Hemoglobin: 9.4 g/dL — ABNORMAL LOW (ref 12.0–15.0)
Immature Granulocytes: 0 %
Lymphocytes Relative: 15 %
Lymphs Abs: 1.3 10*3/uL (ref 0.7–4.0)
MCH: 26.1 pg (ref 26.0–34.0)
MCHC: 30.1 g/dL (ref 30.0–36.0)
MCV: 86.7 fL (ref 78.0–100.0)
Monocytes Absolute: 0.7 10*3/uL (ref 0.1–1.0)
Monocytes Relative: 8 %
Neutro Abs: 6.5 10*3/uL (ref 1.7–7.7)
Neutrophils Relative %: 77 %
Platelets: 355 10*3/uL (ref 150–400)
RBC: 3.6 MIL/uL — ABNORMAL LOW (ref 3.87–5.11)
RDW: 16.8 % — ABNORMAL HIGH (ref 11.5–15.5)
WBC: 8.5 10*3/uL (ref 4.0–10.5)

## 2017-12-23 MED ORDER — DIAZEPAM 5 MG PO TABS
5.0000 mg | ORAL_TABLET | Freq: Once | ORAL | Status: AC
Start: 2017-12-23 — End: 2017-12-23
  Administered 2017-12-23: 5 mg via ORAL
  Filled 2017-12-23: qty 1

## 2017-12-23 MED ORDER — ONDANSETRON 4 MG PO TBDP
4.0000 mg | ORAL_TABLET | Freq: Once | ORAL | Status: AC
  Administered 2017-12-23: 4 mg via ORAL
  Filled 2017-12-23: qty 1

## 2017-12-23 MED ORDER — METHOCARBAMOL 500 MG PO TABS: 500.0000 mg | ORAL_TABLET | Freq: Two times a day (BID) | ORAL | 0 refills | Status: DC

## 2017-12-23 MED ORDER — DEXAMETHASONE SODIUM PHOSPHATE 10 MG/ML IJ SOLN
10.0000 mg | Freq: Once | INTRAMUSCULAR | Status: AC
  Administered 2017-12-23: 10 mg via INTRAVENOUS
  Filled 2017-12-23: qty 1

## 2017-12-23 MED ORDER — ONDANSETRON 4 MG PO TBDP: 4.0000 mg | ORAL_TABLET | Freq: Three times a day (TID) | ORAL | 0 refills | Status: AC | PRN

## 2017-12-23 MED ORDER — MORPHINE SULFATE (PF) 4 MG/ML IV SOLN
4.0000 mg | Freq: Once | INTRAVENOUS | Status: AC
  Administered 2017-12-23: 4 mg via INTRAVENOUS
  Filled 2017-12-23: qty 1

## 2017-12-23 MED ORDER — TRAMADOL HCL 50 MG PO TABS: 50.0000 mg | ORAL_TABLET | Freq: Four times a day (QID) | ORAL | 0 refills | Status: AC | PRN

## 2017-12-23 MED ORDER — PREDNISONE 50 MG PO TABS: 50.0000 mg | ORAL_TABLET | Freq: Every day | ORAL | 0 refills | Status: DC

## 2017-12-23 NOTE — ED Triage Notes (Signed)
Pt c/o left sided back spasm that causes numbness. Denies bowel or bladder incontinence. Pt is suppose to be referred to a specialty therapist

## 2017-12-23 NOTE — ED Notes (Signed)
C/o back spasm states this has been going on for several months and she is in the process of going to outpatient therapy

## 2017-12-23 NOTE — Discharge Instructions (Signed)
Return here as needed. Follow up with your neurosurgeon. °

## 2017-12-23 NOTE — ED Triage Notes (Signed)
Pt took motrin 400mg  1 hour pta. Took leftover flexiril around 2000 without relief

## 2017-12-23 NOTE — ED Notes (Signed)
Patient refused to get on the stretcher states she wanted to stay in the wheelchair

## 2017-12-25 NOTE — ED Provider Notes (Signed)
[facility]   CSN: [id number] Arrival date & time: 12/23/17  0143     History   Chief Complaint Chief Complaint  Patient presents with  . back spasm    HPI Melanie Henderson is a 72 y.o. female.  HPI Patient presents to the emergency department with lower back pain on the right that radiates into the right lower extremity.  The patient states that she is having back spasms.  She states she has had this multiple times over the last few months.  She was seen by neurosurgery and had an MRI that showed no significant abnormalities other than some slight disc bulging.  Patient is slated to start physical therapy.  Patient states that last time she had this she received prednisone and muscle relaxant.  The patient denies chest pain, shortness of breath, headache,blurred vision, neck pain, fever, cough, weakness, numbness, dizziness, anorexia, edema, abdominal pain, nausea, vomiting, diarrhea, rash, dysuria, hematemesis, bloody stool, near syncope, or syncope. Past Medical History:  Diagnosis Date  . Chronic back pain greater than 3 months duration   . Hypertension     There are no active problems to display for this patient.   No past surgical history on file.   OB History   None      Home Medications    Prior to Admission medications   Medication Sig Start Date End Date Taking? Authorizing Provider  aspirin-acetaminophen-caffeine (EXCEDRIN MIGRAINE) [id number] MG per tablet Take 1 tablet by mouth every 6 (six) hours as needed for headache.    Yes [provider]  Cholecalciferol (VITAMIN D) 2000 units CAPS Take 2,000 Units by mouth daily.   Yes [provider]  Cranberry 500 MG TABS Take 500 mg by mouth daily.   Yes [provider]  lisinopril (PRINIVIL,ZESTRIL) 20 MG tablet Take 20 mg by mouth daily. 10/10/17  Yes [provider]  cyclobenzaprine (FLEXERIL) 5 MG tablet Take 1 tablet (5 mg  total) by mouth at bedtime as needed for muscle spasms. Patient not taking: Reported on 11/11/2017 08/11/13   Rodolph Bongorey, Evan S, MD  lisinopril (PRINIVIL,ZESTRIL) 10 MG tablet Take 1 tablet (10 mg total) by mouth daily. Patient not taking: Reported on 11/11/2017 10/05/12   Reuben LikesKeller, David C, MD  methocarbamol (ROBAXIN) 500 MG tablet Take 1 tablet (500 mg total) by mouth 2 (two) times daily. 12/23/17   Raffaele Derise, Cristal Deerhristopher, PA-C  ondansetron (ZOFRAN-ODT) 4 MG disintegrating tablet Take 1 tablet (4 mg total) by mouth every 8 (eight) hours as needed for nausea or vomiting. 12/23/17   Casi Westerfeld, Cristal Deerhristopher, PA-C  predniSONE (DELTASONE) 50 MG tablet Take 1 tablet (50 mg total) by mouth daily with breakfast. 12/23/17   Jayden Rudge, Cristal Deerhristopher, PA-C  traMADol (ULTRAM) 50 MG tablet Take 1 tablet (50 mg total) by mouth every 6 (six) hours as needed for severe pain. 12/23/17   Amalie Koran, Cristal Deerhristopher, PA-C    Family History No family history on file.  Social History Social History   Tobacco Use  . Smoking status: Never Smoker  . Smokeless tobacco: Never Used  Substance Use Topics  . Alcohol use: No  . Drug use: No     Allergies   Amoxicillin   Review of Systems Review of Systems All other systems negative except as documented in the HPI. All pertinent positives and negatives as reviewed in the HPI.  Physical Exam Updated Vital Signs BP 113/65 (BP Location: Right Arm)   Pulse 86   Temp 98.4  F (36.9 C) (Oral)   Resp 16   Ht 5' (1.524 m)   Wt 74.8 kg (165 lb)   SpO2 98%   BMI 32.22 kg/m   Physical Exam  Constitutional: She is oriented to person, place, and time. She appears well-developed and well-nourished. No distress.  HENT:  Head: Normocephalic and atraumatic.  Mouth/Throat: Oropharynx is clear and moist.  Eyes: Pupils are equal, round, and reactive to light.  Neck: Normal range of motion. Neck supple.  Cardiovascular: Normal rate, regular rhythm and normal heart sounds. Exam reveals no gallop and  no friction rub.  No murmur heard. Pulmonary/Chest: Effort normal and breath sounds normal. No respiratory distress. She has no wheezes.  Musculoskeletal:       Lumbar back: She exhibits tenderness and pain. She exhibits normal range of motion, no edema, no deformity, no laceration and no spasm.       Back:  Neurological: She is alert and oriented to person, place, and time. She displays normal reflexes. No sensory deficit. She exhibits normal muscle tone. Coordination normal.  Skin: Skin is warm and dry. Capillary refill takes less than 2 seconds. No rash noted. No erythema.  Psychiatric: She has a normal mood and affect. Her behavior is normal.  Nursing note and vitals reviewed.    ED Treatments / Results  Labs (all labs ordered are listed, but only abnormal results are displayed) Labs Reviewed  BASIC METABOLIC PANEL - Abnormal; Notable for the following components:      Result Value   Glucose, Bld 116 (*)    All other components within normal limits  CBC WITH DIFFERENTIAL/PLATELET - Abnormal; Notable for the following components:   RBC 3.60 (*)    Hemoglobin 9.4 (*)    HCT 31.2 (*)    RDW 16.8 (*)    All other components within normal limits    EKG None  Radiology No results found.  Procedures Procedures (including critical care time)  Medications Ordered in ED Medications  diazepam (VALIUM) tablet 5 mg (5 mg Oral Given 12/23/17 0818)  morphine 4 MG/ML injection 4 mg (4 mg Intravenous Given 12/23/17 0819)  dexamethasone (DECADRON) injection 10 mg (10 mg Intravenous Given 12/23/17 0818)  ondansetron (ZOFRAN-ODT) disintegrating tablet 4 mg (4 mg Oral Given 12/23/17 0837)     Initial Impression / Assessment and Plan / ED Course  I have reviewed the triage vital signs and the nursing notes.  Pertinent labs & imaging results that were available during my care of the patient were reviewed by me and considered in my medical decision making (see chart for details).      Patient be treated for her sciatic pain.  She does not have any neurological deficits noted on exam she has normal reflexes normal strength in her lower extremities.  Final Clinical Impressions(s) / ED Diagnoses   Final diagnoses:  Sciatica of left side    ED Discharge Orders        Ordered    predniSONE (DELTASONE) 50 MG tablet  Daily with breakfast     12/23/17 1113    traMADol (ULTRAM) 50 MG tablet  Every 6 hours PRN     12/23/17 1113    methocarbamol (ROBAXIN) 500 MG tablet  2 times daily     12/23/17 1113    ondansetron (ZOFRAN-ODT) 4 MG disintegrating tablet  Every 8 hours PRN     12/23/17 1115       Charlestine Night, PA-C 12/25/17 1514    Silverio Lay,  Gonzella Lex, MD 12/25/17 [phone]

## 2017-12-26 DIAGNOSIS — M545 Low back pain: Secondary | ICD-10-CM | POA: Diagnosis not present

## 2017-12-26 DIAGNOSIS — M5116 Intervertebral disc disorders with radiculopathy, lumbar region: Secondary | ICD-10-CM | POA: Diagnosis not present

## 2017-12-29 DIAGNOSIS — M5116 Intervertebral disc disorders with radiculopathy, lumbar region: Secondary | ICD-10-CM | POA: Diagnosis not present

## 2017-12-29 DIAGNOSIS — M545 Low back pain: Secondary | ICD-10-CM | POA: Diagnosis not present

## 2017-12-31 DIAGNOSIS — M5116 Intervertebral disc disorders with radiculopathy, lumbar region: Secondary | ICD-10-CM | POA: Diagnosis not present

## 2017-12-31 DIAGNOSIS — M545 Low back pain: Secondary | ICD-10-CM | POA: Diagnosis not present

## 2018-01-02 DIAGNOSIS — M545 Low back pain: Secondary | ICD-10-CM | POA: Diagnosis not present

## 2018-01-18 DIAGNOSIS — M545 Low back pain: Secondary | ICD-10-CM | POA: Diagnosis not present

## 2018-02-03 ENCOUNTER — Other Ambulatory Visit: Payer: Self-pay

## 2018-02-03 ENCOUNTER — Encounter (HOSPITAL_COMMUNITY): Payer: Self-pay | Admitting: Emergency Medicine

## 2018-02-03 ENCOUNTER — Emergency Department (HOSPITAL_COMMUNITY)
Admission: EM | Admit: 2018-02-03 | Discharge: 2018-02-03 | Disposition: A | Discharge status: Home / Self Care | Payer: PPO | Attending: Emergency Medicine | Admitting: Emergency Medicine

## 2018-02-03 DIAGNOSIS — M5442 Lumbago with sciatica, left side: Secondary | ICD-10-CM | POA: Insufficient documentation

## 2018-02-03 DIAGNOSIS — Z79899 Other long term (current) drug therapy: Secondary | ICD-10-CM | POA: Diagnosis not present

## 2018-02-03 DIAGNOSIS — I10 Essential (primary) hypertension: Secondary | ICD-10-CM | POA: Insufficient documentation

## 2018-02-03 DIAGNOSIS — G8929 Other chronic pain: Secondary | ICD-10-CM | POA: Diagnosis not present

## 2018-02-03 DIAGNOSIS — M62830 Muscle spasm of back: Secondary | ICD-10-CM | POA: Diagnosis present

## 2018-02-03 MED ORDER — HYDROMORPHONE HCL 1 MG/ML IJ SOLN
1.0000 mg | Freq: Once | INTRAMUSCULAR | Status: AC
  Administered 2018-02-03: 1 mg via INTRAMUSCULAR
  Filled 2018-02-03: qty 1

## 2018-02-03 MED ORDER — ONDANSETRON 4 MG PO TBDP
4.0000 mg | ORAL_TABLET | Freq: Once | ORAL | Status: AC
  Administered 2018-02-03: 4 mg via ORAL
  Filled 2018-02-03: qty 1

## 2018-02-03 MED ORDER — DIAZEPAM 2 MG PO TABS
2.0000 mg | ORAL_TABLET | Freq: Once | ORAL | Status: AC
  Administered 2018-02-03: 2 mg via ORAL
  Filled 2018-02-03: qty 1

## 2018-02-03 NOTE — ED Notes (Signed)
Called pt to come back for triage. No response.  

## 2018-02-03 NOTE — ED Notes (Signed)
ED Provider at bedside. 

## 2018-02-03 NOTE — ED Triage Notes (Signed)
Pt reports chronic pain from a pinched nerve in her lower back but states today she is having increased muscle spasm unrelieved by her muscle relaxer (metaxalone) and pain medicine (last taken about 1 hour ago). Denies incontinence or weakness in extremities.

## 2018-02-03 NOTE — ED Provider Notes (Signed)
[facility] EMERGENCY DEPARTMENT Provider Note   CSN: [id number] Arrival date & time: 02/03/18  1824     History   Chief Complaint Chief Complaint  Patient presents with  . Spasms    HPI Melanie Henderson is a 72 y.o. female with past medical history of chronic back pain, hypertension is here for "muscle spasms".  Pain is severe, localized to the left lower back and radiates in her left buttock.  Pain is constant but intermittently worsens to a 10/10 with any movement or palpation.  States her pain is similar to her previous flares of back pain in the past.  She took a muscle relaxer that did not help.  Patient states that she typically gets IV shots for her pain.  States she is to do physical therapy for her chronic back pain but states that every time she did it her pain got worse afterwards.  She has been doing some home exercises at home and felt like she was doing better.  She was helping her daughter move recently and thinks she may have overdone it.  She denies any recent falls or trauma.  Denies associated fevers, abdominal pain, dysuria, urinary frequency or urgency, changes in bowel movements, groin numbness, loss of sensation or paresthesias or weakness to her extremities.   HPI  Past Medical History:  Diagnosis Date  . Chronic back pain greater than 3 months duration   . Hypertension     There are no active problems to display for this patient.   History reviewed. No pertinent surgical history.   OB History   None      Home Medications    Prior to Admission medications   Medication Sig Start Date End Date Taking? Authorizing Provider  aspirin-acetaminophen-caffeine (EXCEDRIN MIGRAINE) [phone] MG per tablet Take 1 tablet by mouth every 6 (six) hours as needed for headache.     [provider]  Cholecalciferol (VITAMIN D) 2000 units CAPS Take 2,000 Units by mouth daily.    [provider]  Cranberry 500 MG TABS Take 500 mg by  mouth daily.    [provider]  cyclobenzaprine (FLEXERIL) 5 MG tablet Take 1 tablet (5 mg total) by mouth at bedtime as needed for muscle spasms. Patient not taking: Reported on 11/11/2017 08/11/13   Rodolph Bong, MD  lisinopril (PRINIVIL,ZESTRIL) 10 MG tablet Take 1 tablet (10 mg total) by mouth daily. Patient not taking: Reported on 11/11/2017 10/05/12   Reuben Likes, MD  lisinopril (PRINIVIL,ZESTRIL) 20 MG tablet Take 20 mg by mouth daily. 10/10/17   [provider]  methocarbamol (ROBAXIN) 500 MG tablet Take 1 tablet (500 mg total) by mouth 2 (two) times daily. 12/23/17   Lawyer, Cristal Deer, PA-C  ondansetron (ZOFRAN-ODT) 4 MG disintegrating tablet Take 1 tablet (4 mg total) by mouth every 8 (eight) hours as needed for nausea or vomiting. 12/23/17   Lawyer, Cristal Deer, PA-C  predniSONE (DELTASONE) 50 MG tablet Take 1 tablet (50 mg total) by mouth daily with breakfast. 12/23/17   Lawyer, Cristal Deer, PA-C  traMADol (ULTRAM) 50 MG tablet Take 1 tablet (50 mg total) by mouth every 6 (six) hours as needed for severe pain. 12/23/17   Lawyer, Cristal Deer, PA-C    Family History No family history on file.  Social History Social History   Tobacco Use  . Smoking status: Never Smoker  . Smokeless tobacco: Never Used  Substance Use Topics  . Alcohol use: No  . Drug use: No  Allergies   Amoxicillin   Review of Systems Review of Systems  Musculoskeletal: Positive for back pain and gait problem.  All other systems reviewed and are negative.    Physical Exam Updated Vital Signs BP (!) 133/59   Pulse 90   Temp 98.8 F (37.1 C) (Oral)   Resp 18   Ht 5' (1.524 m)   Wt 74.8 kg   SpO2 97%   BMI 32.22 kg/m   Physical Exam  Constitutional: She is oriented to person, place, and time. She appears well-developed and well-nourished. No distress.  HENT:  Head: Normocephalic and atraumatic.  Nose: Nose normal.  Eyes: EOM are normal.  Cardiovascular: Normal rate, S1  normal, S2 normal and normal heart sounds.  Pulses:      Radial pulses are 2+ on the right side, and 2+ on the left side.       Dorsalis pedis pulses are 2+ on the right side, and 2+ on the left side.  Pulmonary/Chest: Effort normal and breath sounds normal. She has no decreased breath sounds.  Abdominal: Soft. Normal appearance and bowel sounds are normal. There is no tenderness.  No suprapubic or CVA tenderness   Musculoskeletal: She exhibits tenderness.       Lumbar back: She exhibits tenderness and pain.  T-spine: no midline or paraspinal muscle tenderness L-spine: no midline tenderness. L sided paraspinal muscle tenderness with light touch.  TTP to left SI joint and sciatic notch.  Positive SLR (left)   Neurological: She is alert and oriented to person, place, and time.  5/5 strength with flexion/extension of hip, knee and ankle, bilaterally.  Sensation to light touch intact in lower extremities including feet  Skin: Skin is warm and dry. Capillary refill takes less than 2 seconds.  Psychiatric: She has a normal mood and affect. Her speech is normal and behavior is normal. Judgment and thought content normal. Cognition and memory are normal.  Nursing note and vitals reviewed.    ED Treatments / Results  Labs (all labs ordered are listed, but only abnormal results are displayed) Labs Reviewed - No data to display  EKG None  Radiology No results found.  Procedures Procedures (including critical care time)  Medications Ordered in ED Medications  diazepam (VALIUM) tablet 2 mg (2 mg Oral Given 02/03/18 2043)  HYDROmorphone (DILAUDID) injection 1 mg (1 mg Intramuscular Given 02/03/18 2111)  ondansetron (ZOFRAN-ODT) disintegrating tablet 4 mg (4 mg Oral Given 02/03/18 2038)     Initial Impression / Assessment and Plan / ED Course  I have reviewed the triage vital signs and the nursing notes.  Pertinent labs & imaging results that were available during my care of the patient  were reviewed by me and considered in my medical decision making (see chart for details).     Initial differential diagnoses include muscular strain or spasms.  She has known confirmed disc disease from recent MRI so most likely radicular symptoms as well.  Less likely vertebral compression fx, pyelonephritis, nephrolithiasis, epidural abscess, AAA, dissection, cauda equina.  No red flag symptoms of back pain including: bladder/bowel incontinence or retention, fevers, chills, unexplained weight loss, h/o cancer, IVDU, recent trauma, overlaying rash, focal neuro deficits, UTI symptoms or CVAT, abdominal pain, midline spinous process tenderness.  No h/o kidney stones. Low concern for cauda equina, epidural abscess, or other serious cause of back pain at this time.     ED labs and imaging not indicated today as abdominal and MSK exam reassuring and no red  flag symptoms present.  I do not suspect bony or intraabdominal/pelvic emergency at this time. I have reviewed pt's chart and she is frequently in ER for similar flare ups.  Discussed importance of f/u with Dr Danielle DessElsner for chronic back pain.  ED return precautions discussed with pt who verbalized understanding and was agreeable with plan.  Final Clinical Impressions(s) / ED Diagnoses   Final diagnoses:  Chronic left-sided low back pain with left-sided sciatica    ED Discharge Orders    None       Jerrell MylarGibbons, Kayen Grabel J, PA-C 02/03/18 2238    Pricilla LovelessGoldston, Scott, MD 02/03/18 2239

## 2018-02-03 NOTE — ED Notes (Signed)
Signature pad unavailable. Pt verbalized understanding of d/c instructions and follow up care. Pt denied any further requests. Pt d/ced with spouse.

## 2018-02-03 NOTE — Discharge Instructions (Signed)
You were seen in the emergency department for acute flare of your chronic back pain.  You would need to follow-up with your neurosurgeon for further management of this chronic pain.  Return to the ER for abdominal pain, burning with urination, blood in your urine, diarrhea, constipation, numbness to your groin, loss of bladder or bowel control, loss of sensation heaviness or weakness to your extremities.

## 2018-02-03 NOTE — ED Provider Notes (Signed)
Patient placed in Quick Look pathway, seen and evaluated   Chief Complaint: Back pain, muscle spasm   HPI:   Patient with chronic back pain with known neural impingement presents with acute on chronic flareup.  Worsens with bending.  Radiates to the left hip and she has numbness traveling down the posterior aspect of her left leg.  Denies fevers, bowel or bladder incontinence, saddle anesthesia.  Has tried metaxalone without relief of her symptoms.  ROS: Positive for back pain, numbness Negative for weakness, fevers, incontinence  Physical Exam:   Gen: No distress  Neuro: Awake and Alert  Skin: Warm    Focused Exam: Appears uncomfortable.  Diffuse tenderness to palpation of the lumbar spine along the midline and left paralumbar musculature.  Difficult to examine secondary to pain.  4+/5 strength of left hip flexor and otherwise 5/5 strength of BLE major muscle groups.  Again appears limited secondary to pain.   Initiation of care has begun. The patient has been counseled on the process, plan, and necessity for staying for the completion/evaluation, and the remainder of the medical screening examination    Bennye AlmFawze, Remigio Mcmillon A, PA-C 02/03/18 1903    Charlynne PanderYao, David Hsienta, MD 02/03/18 [phone]

## 2018-02-10 DIAGNOSIS — Z6831 Body mass index (BMI) 31.0-31.9, adult: Secondary | ICD-10-CM | POA: Diagnosis not present

## 2018-02-10 DIAGNOSIS — I10 Essential (primary) hypertension: Secondary | ICD-10-CM | POA: Diagnosis not present

## 2018-04-17 DIAGNOSIS — Z23 Encounter for immunization: Secondary | ICD-10-CM | POA: Diagnosis not present

## 2018-07-17 ENCOUNTER — Other Ambulatory Visit: Payer: Self-pay

## 2018-07-17 ENCOUNTER — Emergency Department (HOSPITAL_COMMUNITY)
Admission: EM | Admit: 2018-07-17 | Discharge: 2018-07-18 | Disposition: A | Discharge status: Home / Self Care | Payer: PPO | Attending: Emergency Medicine | Admitting: Emergency Medicine

## 2018-07-17 ENCOUNTER — Encounter (HOSPITAL_COMMUNITY): Payer: Self-pay | Admitting: Emergency Medicine

## 2018-07-17 DIAGNOSIS — G8929 Other chronic pain: Secondary | ICD-10-CM

## 2018-07-17 DIAGNOSIS — I10 Essential (primary) hypertension: Secondary | ICD-10-CM | POA: Insufficient documentation

## 2018-07-17 DIAGNOSIS — Z79899 Other long term (current) drug therapy: Secondary | ICD-10-CM | POA: Diagnosis not present

## 2018-07-17 DIAGNOSIS — M545 Low back pain, unspecified: Secondary | ICD-10-CM

## 2018-07-17 NOTE — ED Triage Notes (Signed)
C/o lower back and R hip spasms that started 30 min ago.  Reports history of same.  Pt here visiting husband in ICU.

## 2018-07-18 MED ORDER — PREDNISONE 20 MG PO TABS
60.0000 mg | ORAL_TABLET | Freq: Once | ORAL | Status: AC
Start: 2018-07-18 — End: 2018-07-18
  Administered 2018-07-18: 60 mg via ORAL
  Filled 2018-07-18: qty 3

## 2018-07-18 MED ORDER — KETOROLAC TROMETHAMINE 60 MG/2ML IM SOLN
60.0000 mg | Freq: Once | INTRAMUSCULAR | Status: AC
  Administered 2018-07-18: 60 mg via INTRAMUSCULAR
  Filled 2018-07-18: qty 2

## 2018-07-18 MED ORDER — PREDNISONE 20 MG PO TABS: ORAL_TABLET | ORAL | 0 refills | Status: AC

## 2018-07-18 MED ORDER — TIZANIDINE HCL 2 MG PO CAPS: 2.0000 mg | ORAL_CAPSULE | Freq: Two times a day (BID) | ORAL | 0 refills | Status: AC | PRN

## 2018-07-18 MED ORDER — TIZANIDINE HCL 4 MG PO TABS
4.0000 mg | ORAL_TABLET | Freq: Once | ORAL | Status: AC
  Administered 2018-07-18: 4 mg via ORAL
  Filled 2018-07-18: qty 1

## 2018-07-18 NOTE — ED Provider Notes (Signed)
[facility] EMERGENCY DEPARTMENT Provider Note   CSN: [id number] Arrival date & time: 07/17/18  2159     History   Chief Complaint Chief Complaint  Patient presents with  . Back Pain    HPI Melanie Henderson is a 73 y.o. female with a hx of pretension, chronic low back pain presents to the Emergency Department complaining of gradual, persistent, progressively worsening low back pain onset 3-4 hours ago.  Patient reports right-sided low back pain which radiates into her right hip.  She reports her last flare of back pain was around Christmas time.  She reports that pain today is the same as previous episodes.  She reports she likely stressed her back due to increased activity while her husband is hospitalized.  She denies falling or known injuries.  She denies fever, chills, neck pain, chest pain, abdominal pain, nausea, vomiting, diarrhea, numbness, tingling, weakness, loss of bowel or bladder control.  No treatments prior to arrival.  Movement and palpation make her symptoms worse.  Nothing makes them better.  She denies a history of diabetes or kidney problems.  She reports she takes Mobic daily.  She has taken prednisone with significant relief of pain in the past.  She is not currently taking any prednisone.   The history is provided by the patient, medical records and a relative. No language interpreter was used.    Past Medical History:  Diagnosis Date  . Chronic back pain greater than 3 months duration   . Hypertension     There are no active problems to display for this patient.   History reviewed. No pertinent surgical history.   OB History   No obstetric history on file.      Home Medications    Prior to Admission medications   Medication Sig Start Date End Date Taking? Authorizing Provider  aspirin-acetaminophen-caffeine (EXCEDRIN MIGRAINE) [phone] MG per tablet Take 1 tablet by mouth every 6 (six) hours as needed for headache.     [provider]  Cholecalciferol (VITAMIN D) 2000 units CAPS Take 2,000 Units by mouth daily.    [provider]  Cranberry 500 MG TABS Take 500 mg by mouth daily.    [provider]  lisinopril (PRINIVIL,ZESTRIL) 10 MG tablet Take 1 tablet (10 mg total) by mouth daily. Patient not taking: Reported on 11/11/2017 10/05/12   Reuben Likes, MD  lisinopril (PRINIVIL,ZESTRIL) 20 MG tablet Take 20 mg by mouth daily. 10/10/17   [provider]  ondansetron (ZOFRAN-ODT) 4 MG disintegrating tablet Take 1 tablet (4 mg total) by mouth every 8 (eight) hours as needed for nausea or vomiting. 12/23/17   Lawyer, Cristal Deer, PA-C  predniSONE (DELTASONE) 20 MG tablet 3 tabs po daily x 3 days, then 2 tabs x 3 days, then 1.5 tabs x 3 days, then 1 tab x 3 days, then 0.5 tabs x 3 days 07/18/18   Nasha Diss, Dahlia Client, PA-C  tizanidine (ZANAFLEX) 2 MG capsule Take 1 capsule (2 mg total) by mouth 2 (two) times daily as needed for muscle spasms. 07/18/18   Cagney Degrace, Dahlia Client, PA-C  traMADol (ULTRAM) 50 MG tablet Take 1 tablet (50 mg total) by mouth every 6 (six) hours as needed for severe pain. 12/23/17   Lawyer, Cristal Deer, PA-C    Family History No family history on file.  Social History Social History   Tobacco Use  . Smoking status: Never Smoker  . Smokeless tobacco: Never Used  Substance Use Topics  . Alcohol use:  No  . Drug use: No     Allergies   Amoxicillin   Review of Systems Review of Systems  Constitutional: Negative for fatigue and fever.  Respiratory: Negative for chest tightness and shortness of breath.   Cardiovascular: Negative for chest pain.  Gastrointestinal: Negative for abdominal pain, diarrhea, nausea and vomiting.  Genitourinary: Negative for dysuria, frequency, hematuria and urgency.  Musculoskeletal: Positive for back pain and gait problem ( 2/2 pain). Negative for joint swelling, neck pain and neck stiffness.  Skin: Negative for rash.    Allergic/Immunologic: Negative for immunocompromised state.  Neurological: Negative for weakness, light-headedness, numbness and headaches.  Hematological: Negative for adenopathy. Does not bruise/bleed easily.  Psychiatric/Behavioral: The patient is not nervous/anxious.   All other systems reviewed and are negative.    Physical Exam Updated Vital Signs BP (!) 162/71 (BP Location: Left Arm)   Pulse 91   Temp 98.5 F (36.9 C) (Oral)   Resp 17   SpO2 99%   Physical Exam Vitals signs and nursing note reviewed.  Constitutional:      General: She is not in acute distress.    Appearance: She is well-developed. She is not diaphoretic.  HENT:     Head: Normocephalic and atraumatic.     Mouth/Throat:     Pharynx: No oropharyngeal exudate.  Eyes:     Conjunctiva/sclera: Conjunctivae normal.  Neck:     Musculoskeletal: Normal range of motion and neck supple.     Comments: Full ROM without pain Cardiovascular:     Rate and Rhythm: Normal rate and regular rhythm.  Pulmonary:     Effort: Pulmonary effort is normal. No respiratory distress.     Breath sounds: Normal breath sounds. No wheezing.  Abdominal:     General: There is no distension.     Palpations: Abdomen is soft.     Tenderness: There is no abdominal tenderness.  Musculoskeletal:     Comments: Somewhat decreased range of motion of the T-spine and L-spine No midline tenderness to the  T-spine or L-spine Tenderness to palpation of the right paraspinous muscles of the L-spine  Lymphadenopathy:     Cervical: No cervical adenopathy.  Skin:    General: Skin is warm and dry.     Findings: No erythema or rash.  Neurological:     Mental Status: She is alert.     Comments: Speech is clear and goal oriented, follows commands Normal 5/5 strength in upper and lower extremities bilaterally including dorsiflexion and plantar flexion, strong and equal grip strength Sensation normal to light and sharp touch Moves extremities  without ataxia, coordination intact Antalgic gait Normal balance No Clonus  Psychiatric:        Behavior: Behavior normal.      ED Treatments / Results   Procedures Procedures (including critical care time)  Medications Ordered in ED Medications  ketorolac (TORADOL) injection 60 mg (60 mg Intramuscular Given 07/18/18 0148)  predniSONE (DELTASONE) tablet 60 mg (60 mg Oral Given 07/18/18 0147)  tiZANidine (ZANAFLEX) tablet 4 mg (4 mg Oral Given 07/18/18 0147)     Initial Impression / Assessment and Plan / ED Course  I have reviewed the triage vital signs and the nursing notes.  Pertinent labs & imaging results that were available during my care of the patient were reviewed by me and considered in my medical decision making (see chart for details).  Clinical Course as of Jul 18 301  Sat Jul 18, 2018  0303 Reports her pain is  better.  Her heart rate has improved.  She is ambulatory here in the emergency department without difficulty.   [HM]    Clinical Course User Index [HM] Jame Morrell, Dahlia ClientHannah, [place]PA-C    Patient with you on chronic low back pain.  No neurological deficits and normal neuro exam.  Patient can walk but states it is painful.  Pain has improved significantly here in the emergency room.  No loss of bowel or bladder control.  No concern for cauda equina.  No fever, night sweats, weight loss, h/o cancer, IVDU.  RICE protocol and pain medicine indicated and discussed with patient.  Will change her muscle relaxer.  She is to follow with her primary care provider in several days.  Patient states understanding and is in agreement with the plan.   Final Clinical Impressions(s) / ED Diagnoses   Final diagnoses:  Acute exacerbation of chronic low back pain    ED Discharge Orders         Ordered    predniSONE (DELTASONE) 20 MG tablet     07/18/18 0303    tizanidine (ZANAFLEX) 2 MG capsule  2 times daily PRN     07/18/18 0303           Paquita Printy, Boyd KerbsHannah,  PA-C 07/18/18 0304    Shaune PollackIsaacs, [place], MD 07/18/18 [phone]

## 2018-07-18 NOTE — ED Notes (Signed)
D/c instructions, medications, follow up and return precautions d/w pt and family member.  Pt verbalized understanding of the above.  Unable to obtain e-sign d/t equipment malfunction.

## 2018-07-18 NOTE — Discharge Instructions (Signed)
1. Medications: Continue taking meloxicam, stop taking Flexeril and Robaxin, begin taking Zanaflex and prednisone, usual home medications 2. Treatment: rest, drink plenty of fluids, gentle stretching as discussed, alternate ice and heat 3. Follow Up: Please followup with your primary doctor in 3 days for discussion of your diagnoses and further evaluation after today's visit; if you do not have a primary care doctor use the resource guide provided to find one;  Return to the ER for worsening back pain, difficulty walking, loss of bowel or bladder control or other concerning symptoms

## 2018-08-06 DIAGNOSIS — J019 Acute sinusitis, unspecified: Secondary | ICD-10-CM | POA: Diagnosis not present

## 2018-11-04 DIAGNOSIS — I10 Essential (primary) hypertension: Secondary | ICD-10-CM | POA: Diagnosis not present

## 2018-11-04 DIAGNOSIS — Z79899 Other long term (current) drug therapy: Secondary | ICD-10-CM | POA: Diagnosis not present

## 2018-11-04 DIAGNOSIS — D649 Anemia, unspecified: Secondary | ICD-10-CM | POA: Diagnosis not present

## 2018-11-04 DIAGNOSIS — E559 Vitamin D deficiency, unspecified: Secondary | ICD-10-CM | POA: Diagnosis not present

## 2018-11-05 DIAGNOSIS — R195 Other fecal abnormalities: Secondary | ICD-10-CM | POA: Diagnosis not present

## 2018-11-05 DIAGNOSIS — K649 Unspecified hemorrhoids: Secondary | ICD-10-CM | POA: Diagnosis not present

## 2018-11-05 DIAGNOSIS — D649 Anemia, unspecified: Secondary | ICD-10-CM | POA: Diagnosis not present

## 2018-11-05 DIAGNOSIS — Z1211 Encounter for screening for malignant neoplasm of colon: Secondary | ICD-10-CM | POA: Diagnosis not present

## 2018-11-05 DIAGNOSIS — M519 Unspecified thoracic, thoracolumbar and lumbosacral intervertebral disc disorder: Secondary | ICD-10-CM | POA: Diagnosis not present

## 2018-11-05 DIAGNOSIS — Z79899 Other long term (current) drug therapy: Secondary | ICD-10-CM | POA: Diagnosis not present

## 2018-11-05 DIAGNOSIS — M65312 Trigger thumb, left thumb: Secondary | ICD-10-CM | POA: Diagnosis not present

## 2018-11-05 DIAGNOSIS — F4321 Adjustment disorder with depressed mood: Secondary | ICD-10-CM | POA: Diagnosis not present

## 2018-11-05 DIAGNOSIS — I10 Essential (primary) hypertension: Secondary | ICD-10-CM | POA: Diagnosis not present

## 2018-11-05 DIAGNOSIS — Z0000 Encounter for general adult medical examination without abnormal findings: Secondary | ICD-10-CM | POA: Diagnosis not present

## 2018-11-05 DIAGNOSIS — E559 Vitamin D deficiency, unspecified: Secondary | ICD-10-CM | POA: Diagnosis not present

## 2018-11-16 DIAGNOSIS — Z1211 Encounter for screening for malignant neoplasm of colon: Secondary | ICD-10-CM | POA: Diagnosis not present

## 2019-11-09 ENCOUNTER — Other Ambulatory Visit: Payer: Self-pay | Admitting: Family Medicine

## 2019-11-09 DIAGNOSIS — E2839 Other primary ovarian failure: Secondary | ICD-10-CM

## 2019-11-09 DIAGNOSIS — M519 Unspecified thoracic, thoracolumbar and lumbosacral intervertebral disc disorder: Secondary | ICD-10-CM | POA: Diagnosis not present

## 2019-11-09 DIAGNOSIS — E559 Vitamin D deficiency, unspecified: Secondary | ICD-10-CM | POA: Diagnosis not present

## 2019-11-09 DIAGNOSIS — Z1159 Encounter for screening for other viral diseases: Secondary | ICD-10-CM | POA: Diagnosis not present

## 2019-11-09 DIAGNOSIS — Z0000 Encounter for general adult medical examination without abnormal findings: Secondary | ICD-10-CM | POA: Diagnosis not present

## 2019-11-09 DIAGNOSIS — M8588 Other specified disorders of bone density and structure, other site: Secondary | ICD-10-CM | POA: Diagnosis not present

## 2019-11-09 DIAGNOSIS — Z79899 Other long term (current) drug therapy: Secondary | ICD-10-CM | POA: Diagnosis not present

## 2019-11-09 DIAGNOSIS — L649 Androgenic alopecia, unspecified: Secondary | ICD-10-CM | POA: Diagnosis not present

## 2019-11-09 DIAGNOSIS — I10 Essential (primary) hypertension: Secondary | ICD-10-CM | POA: Diagnosis not present

## 2019-11-09 DIAGNOSIS — D509 Iron deficiency anemia, unspecified: Secondary | ICD-10-CM | POA: Diagnosis not present

## 2019-11-09 DIAGNOSIS — K449 Diaphragmatic hernia without obstruction or gangrene: Secondary | ICD-10-CM | POA: Diagnosis not present

## 2019-11-09 DIAGNOSIS — Z1211 Encounter for screening for malignant neoplasm of colon: Secondary | ICD-10-CM | POA: Diagnosis not present

## 2019-11-12 DIAGNOSIS — Z1211 Encounter for screening for malignant neoplasm of colon: Secondary | ICD-10-CM | POA: Diagnosis not present

## 2020-02-03 DIAGNOSIS — Z8719 Personal history of other diseases of the digestive system: Secondary | ICD-10-CM | POA: Diagnosis not present

## 2020-02-03 DIAGNOSIS — R195 Other fecal abnormalities: Secondary | ICD-10-CM | POA: Diagnosis not present

## 2020-02-26 IMAGING — DX DG LUMBAR SPINE COMPLETE 4+V
5 series · 5 of 5 positions shown · non-contrast
Comparison: None.

CLINICAL DATA: 71-year-old female with low back pain.

EXAM:
LUMBAR SPINE - COMPLETE 4+ VIEW

[l-spine ap]
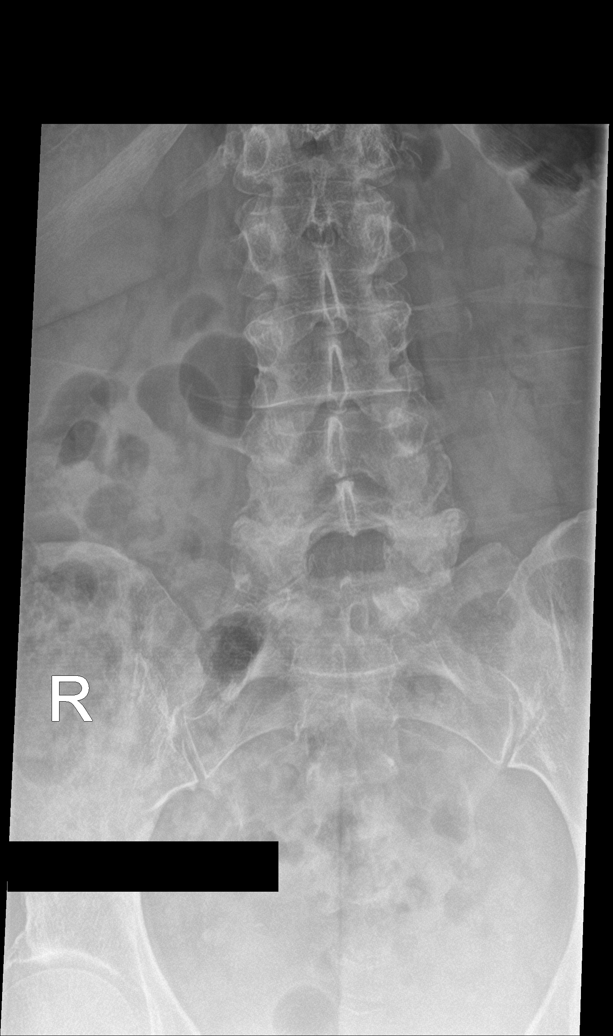

[l-spine obl (1 of 2)]
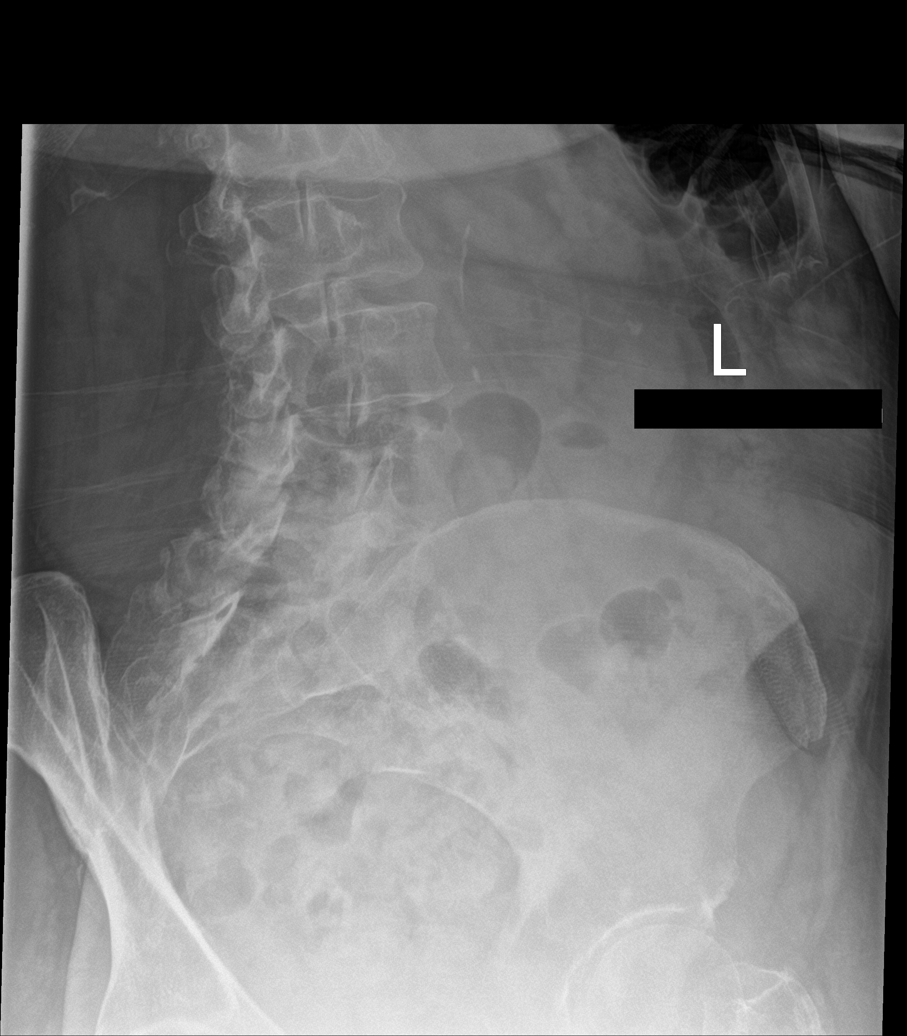

[l-spine obl (2 of 2)]
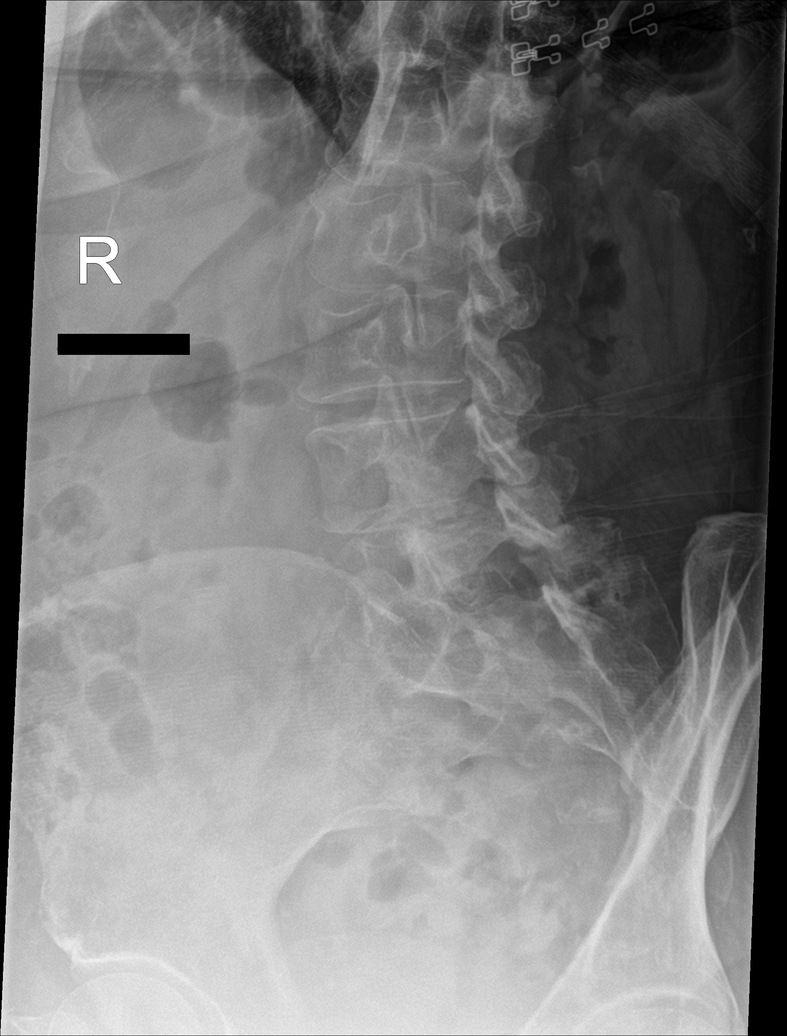

[l-spine lat]
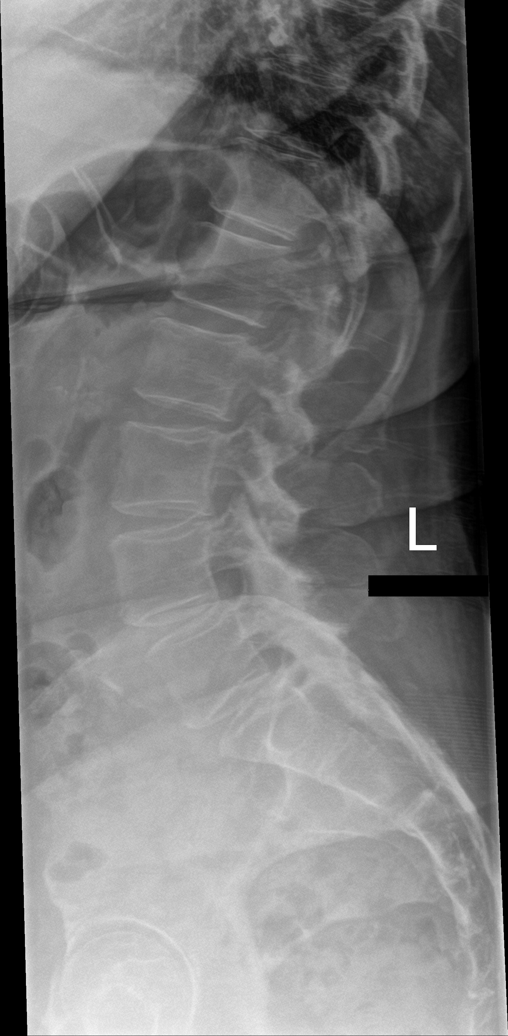

[l-spine spot]
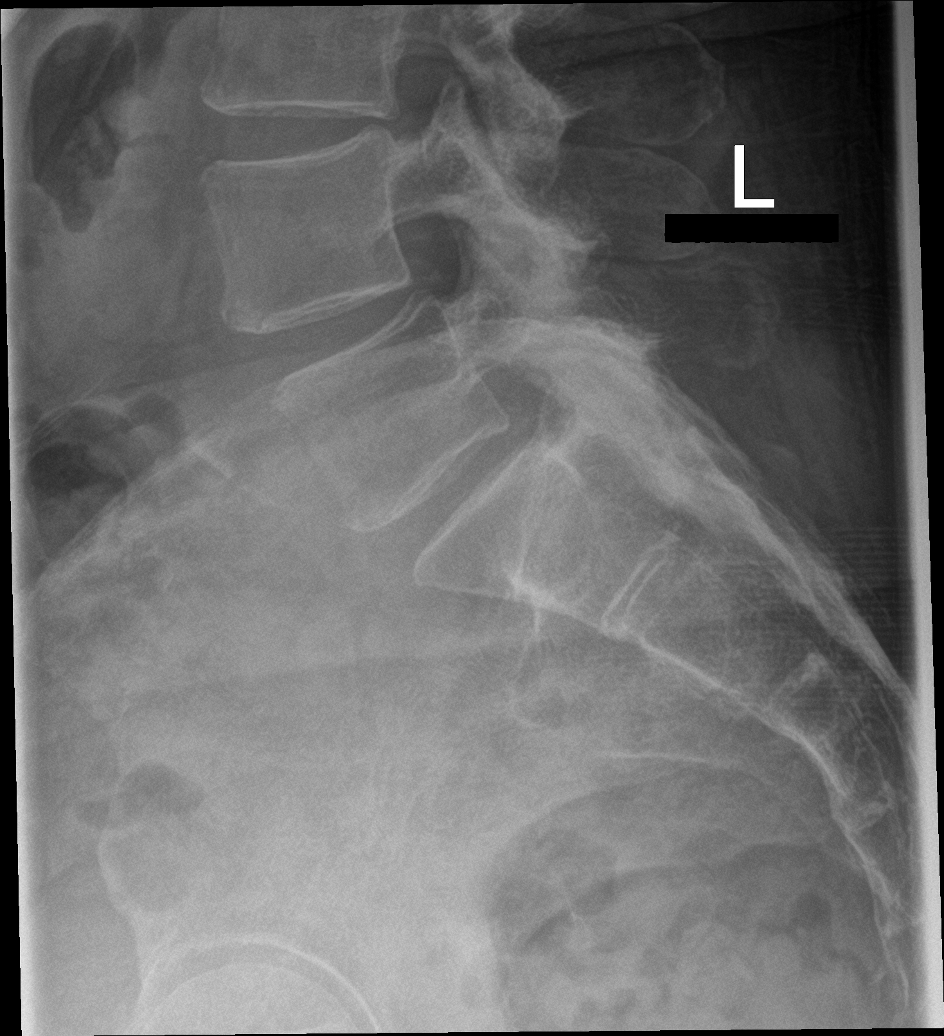

[5 of 5 positions shown; findings below may reference images not displayed]

FINDINGS: No acute fracture or subluxation.

Disc spaces are maintained.

Mild facet arthropathy in the LOWER lumbar spine identified.

No acute bony abnormalities or spondylolysis.
IMPRESSION: 1. No acute abnormality
2. Mild facet arthropathy in the LOWER lumbar spine.

## 2020-04-15 DIAGNOSIS — Z1231 Encounter for screening mammogram for malignant neoplasm of breast: Secondary | ICD-10-CM | POA: Diagnosis not present

## 2020-04-15 DIAGNOSIS — M8589 Other specified disorders of bone density and structure, multiple sites: Secondary | ICD-10-CM | POA: Diagnosis not present

## 2021-01-17 DIAGNOSIS — M519 Unspecified thoracic, thoracolumbar and lumbosacral intervertebral disc disorder: Secondary | ICD-10-CM | POA: Diagnosis not present

## 2021-01-17 DIAGNOSIS — F330 Major depressive disorder, recurrent, mild: Secondary | ICD-10-CM | POA: Diagnosis not present

## 2021-01-17 DIAGNOSIS — D649 Anemia, unspecified: Secondary | ICD-10-CM | POA: Diagnosis not present

## 2021-01-17 DIAGNOSIS — M1990 Unspecified osteoarthritis, unspecified site: Secondary | ICD-10-CM | POA: Diagnosis not present

## 2021-01-17 DIAGNOSIS — K449 Diaphragmatic hernia without obstruction or gangrene: Secondary | ICD-10-CM | POA: Diagnosis not present

## 2021-01-17 DIAGNOSIS — E559 Vitamin D deficiency, unspecified: Secondary | ICD-10-CM | POA: Diagnosis not present

## 2021-01-17 DIAGNOSIS — M8588 Other specified disorders of bone density and structure, other site: Secondary | ICD-10-CM | POA: Diagnosis not present

## 2021-01-17 DIAGNOSIS — Z0000 Encounter for general adult medical examination without abnormal findings: Secondary | ICD-10-CM | POA: Diagnosis not present

## 2021-01-17 DIAGNOSIS — R195 Other fecal abnormalities: Secondary | ICD-10-CM | POA: Diagnosis not present

## 2021-01-17 DIAGNOSIS — Z79899 Other long term (current) drug therapy: Secondary | ICD-10-CM | POA: Diagnosis not present

## 2021-01-17 DIAGNOSIS — I10 Essential (primary) hypertension: Secondary | ICD-10-CM | POA: Diagnosis not present

## 2021-01-17 DIAGNOSIS — L649 Androgenic alopecia, unspecified: Secondary | ICD-10-CM | POA: Diagnosis not present

## 2021-04-02 DIAGNOSIS — J019 Acute sinusitis, unspecified: Secondary | ICD-10-CM | POA: Diagnosis not present

## 2021-04-02 DIAGNOSIS — R059 Cough, unspecified: Secondary | ICD-10-CM | POA: Diagnosis not present

## 2021-12-05 DIAGNOSIS — M5441 Lumbago with sciatica, right side: Secondary | ICD-10-CM | POA: Diagnosis not present

## 2022-03-11 DIAGNOSIS — K649 Unspecified hemorrhoids: Secondary | ICD-10-CM | POA: Diagnosis not present

## 2022-03-11 DIAGNOSIS — M8588 Other specified disorders of bone density and structure, other site: Secondary | ICD-10-CM | POA: Diagnosis not present

## 2022-03-11 DIAGNOSIS — E559 Vitamin D deficiency, unspecified: Secondary | ICD-10-CM | POA: Diagnosis not present

## 2022-03-11 DIAGNOSIS — D649 Anemia, unspecified: Secondary | ICD-10-CM | POA: Diagnosis not present

## 2022-03-11 DIAGNOSIS — M519 Unspecified thoracic, thoracolumbar and lumbosacral intervertebral disc disorder: Secondary | ICD-10-CM | POA: Diagnosis not present

## 2022-03-11 DIAGNOSIS — Z79899 Other long term (current) drug therapy: Secondary | ICD-10-CM | POA: Diagnosis not present

## 2022-03-11 DIAGNOSIS — F330 Major depressive disorder, recurrent, mild: Secondary | ICD-10-CM | POA: Diagnosis not present

## 2022-03-11 DIAGNOSIS — D509 Iron deficiency anemia, unspecified: Secondary | ICD-10-CM | POA: Diagnosis not present

## 2022-03-11 DIAGNOSIS — I10 Essential (primary) hypertension: Secondary | ICD-10-CM | POA: Diagnosis not present

## 2022-03-11 DIAGNOSIS — M1990 Unspecified osteoarthritis, unspecified site: Secondary | ICD-10-CM | POA: Diagnosis not present

## 2022-03-11 DIAGNOSIS — Z0000 Encounter for general adult medical examination without abnormal findings: Secondary | ICD-10-CM | POA: Diagnosis not present

## 2022-03-11 DIAGNOSIS — R195 Other fecal abnormalities: Secondary | ICD-10-CM | POA: Diagnosis not present

## 2022-03-18 DIAGNOSIS — R195 Other fecal abnormalities: Secondary | ICD-10-CM | POA: Diagnosis not present

## 2022-05-17 DIAGNOSIS — M79604 Pain in right leg: Secondary | ICD-10-CM | POA: Diagnosis not present

## 2022-05-17 DIAGNOSIS — R600 Localized edema: Secondary | ICD-10-CM | POA: Diagnosis not present

## 2022-05-17 DIAGNOSIS — I10 Essential (primary) hypertension: Secondary | ICD-10-CM | POA: Diagnosis not present

## 2022-05-17 DIAGNOSIS — M79661 Pain in right lower leg: Secondary | ICD-10-CM | POA: Diagnosis not present

## 2022-05-17 DIAGNOSIS — Z882 Allergy status to sulfonamides status: Secondary | ICD-10-CM | POA: Diagnosis not present

## 2022-05-17 DIAGNOSIS — R2231 Localized swelling, mass and lump, right upper limb: Secondary | ICD-10-CM | POA: Diagnosis not present

## 2022-05-17 DIAGNOSIS — M7989 Other specified soft tissue disorders: Secondary | ICD-10-CM | POA: Diagnosis not present

## 2022-05-17 DIAGNOSIS — Z888 Allergy status to other drugs, medicaments and biological substances status: Secondary | ICD-10-CM | POA: Diagnosis not present

## 2022-06-07 DIAGNOSIS — U071 COVID-19: Secondary | ICD-10-CM | POA: Diagnosis not present

## 2022-06-07 DIAGNOSIS — Z20822 Contact with and (suspected) exposure to covid-19: Secondary | ICD-10-CM | POA: Diagnosis not present

## 2022-06-07 DIAGNOSIS — R0981 Nasal congestion: Secondary | ICD-10-CM | POA: Diagnosis not present

## 2022-11-24 DIAGNOSIS — J019 Acute sinusitis, unspecified: Secondary | ICD-10-CM | POA: Diagnosis not present

## 2023-04-09 DIAGNOSIS — D509 Iron deficiency anemia, unspecified: Secondary | ICD-10-CM | POA: Diagnosis not present

## 2023-04-09 DIAGNOSIS — Z23 Encounter for immunization: Secondary | ICD-10-CM | POA: Diagnosis not present

## 2023-04-09 DIAGNOSIS — F330 Major depressive disorder, recurrent, mild: Secondary | ICD-10-CM | POA: Diagnosis not present

## 2023-04-09 DIAGNOSIS — G250 Essential tremor: Secondary | ICD-10-CM | POA: Diagnosis not present

## 2023-04-09 DIAGNOSIS — E559 Vitamin D deficiency, unspecified: Secondary | ICD-10-CM | POA: Diagnosis not present

## 2023-04-09 DIAGNOSIS — Z0000 Encounter for general adult medical examination without abnormal findings: Secondary | ICD-10-CM | POA: Diagnosis not present

## 2023-04-09 DIAGNOSIS — I10 Essential (primary) hypertension: Secondary | ICD-10-CM | POA: Diagnosis not present

## 2023-04-09 DIAGNOSIS — Z79899 Other long term (current) drug therapy: Secondary | ICD-10-CM | POA: Diagnosis not present

## 2023-05-02 DIAGNOSIS — G8929 Other chronic pain: Secondary | ICD-10-CM | POA: Diagnosis not present

## 2023-05-02 DIAGNOSIS — M546 Pain in thoracic spine: Secondary | ICD-10-CM | POA: Diagnosis not present

## 2023-05-02 DIAGNOSIS — R110 Nausea: Secondary | ICD-10-CM | POA: Diagnosis not present

## 2023-09-29 DIAGNOSIS — S39012A Strain of muscle, fascia and tendon of lower back, initial encounter: Secondary | ICD-10-CM | POA: Diagnosis not present

## 2023-09-29 DIAGNOSIS — R110 Nausea: Secondary | ICD-10-CM | POA: Diagnosis not present

## 2023-09-29 DIAGNOSIS — M545 Low back pain, unspecified: Secondary | ICD-10-CM | POA: Diagnosis not present

## 2023-09-29 DIAGNOSIS — M62830 Muscle spasm of back: Secondary | ICD-10-CM | POA: Diagnosis not present

## 2024-03-23 DIAGNOSIS — M546 Pain in thoracic spine: Secondary | ICD-10-CM | POA: Diagnosis not present

## 2024-03-23 DIAGNOSIS — R110 Nausea: Secondary | ICD-10-CM | POA: Diagnosis not present

## 2024-04-15 DIAGNOSIS — I10 Essential (primary) hypertension: Secondary | ICD-10-CM | POA: Diagnosis not present

## 2024-04-15 DIAGNOSIS — K5909 Other constipation: Secondary | ICD-10-CM | POA: Diagnosis not present

## 2024-04-15 DIAGNOSIS — Z0000 Encounter for general adult medical examination without abnormal findings: Secondary | ICD-10-CM | POA: Diagnosis not present

## 2024-04-15 DIAGNOSIS — I8390 Asymptomatic varicose veins of unspecified lower extremity: Secondary | ICD-10-CM | POA: Diagnosis not present

## 2024-04-15 DIAGNOSIS — E7800 Pure hypercholesterolemia, unspecified: Secondary | ICD-10-CM | POA: Diagnosis not present

## 2024-04-15 DIAGNOSIS — M62830 Muscle spasm of back: Secondary | ICD-10-CM | POA: Diagnosis not present

## 2024-04-15 DIAGNOSIS — Z780 Asymptomatic menopausal state: Secondary | ICD-10-CM | POA: Diagnosis not present

## 2024-04-15 DIAGNOSIS — Z23 Encounter for immunization: Secondary | ICD-10-CM | POA: Diagnosis not present

## 2024-04-15 DIAGNOSIS — D649 Anemia, unspecified: Secondary | ICD-10-CM | POA: Diagnosis not present

## 2024-04-15 DIAGNOSIS — R519 Headache, unspecified: Secondary | ICD-10-CM | POA: Diagnosis not present

## 2024-04-15 DIAGNOSIS — M8580 Other specified disorders of bone density and structure, unspecified site: Secondary | ICD-10-CM | POA: Diagnosis not present

## 2024-04-27 NOTE — Therapy (Unsigned)
 OUTPATIENT PHYSICAL THERAPY THORACOLUMBAR EVALUATION   Patient Name: Melanie Henderson MRN: [id number] DOB:1945/08/16, 78 y.o., female Today's Date: 04/28/2024  END OF SESSION:  PT End of Session - 04/28/24 1550     Visit Number 1    Number of Visits 17    Date for Recertification  06/23/24    Authorization Type [employer]    PT Start Time 1447    PT Stop Time 1530    PT Time Calculation (min) 43 min    Activity Tolerance Patient tolerated treatment well    Behavior During Therapy WFL for tasks assessed/performed          Past Medical History:  Diagnosis Date   Chronic back pain greater than 3 months duration    Hypertension    History reviewed. No pertinent surgical history. There are no active problems to display for this patient.   PCP: Marca Agreste, DO  REFERRING PROVIDER: Marca Agreste, DO  REFERRING DIAG:  Diagnosis  [phone] (ICD-10-CM) - Back muscle spasm    Rationale for Evaluation and Treatment: Rehabilitation  THERAPY DIAG:  Muscle spasm of back  ONSET DATE: Chronic  SUBJECTIVE:                                                                                                                                                                                           SUBJECTIVE STATEMENT: Pt reports she has had low back pain for years, however her pain recently worsened when she was pulling out a turkey from her oven back in April 2025, and a few days after that her low back pain she's had for several years traveled up her right side into the mid back, making it difficult to breathe at times. She went to Urgent Care twice since then, and was told she has muscle spasms in the back. 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 Pt reports her daughter had to wheelchair her into the facility due to extreme pain.   PERTINENT HISTORY:  Chronic LBP  PAIN:  Are you  having pain? Yes: NPRS scale: 3/10 currently 10/10 worse Pain location: right side  Pain description: muscle pull, tightening up in the front Aggravating factors: moving, doing too much lifting  Relieving factors: Steroids, muscle relaxants, pain meds   PRECAUTIONS: None  RED FLAGS: None   WEIGHT BEARING RESTRICTIONS: No  FALLS:  Has patient fallen in last 6 months? No  LIVING ENVIRONMENT: Lives with: lives with their daughter Lives in: Group home Stairs: No Has following equipment at home: Single  point cane  OCCUPATION: Retired , was mudlogger   PLOF: Independent  PATIENT GOALS: I want to so I don't get this back because it's so scary, I don't want this to keep happening  NEXT MD VISIT: Nothing schedule  OBJECTIVE:  Note: Objective measures were completed at Evaluation unless otherwise noted.   PATIENT SURVEYS:  Modified Oswestry:  MODIFIED OSWESTRY DISABILITY SCALE  Date: 04/28/24 Score  Pain intensity 4 =  Pain medication provides me with little relief from pain.  2. Personal care (washing, dressing, etc.) 3 =  I need help, but I am able to manage most of my personal care.  3. Lifting 2 = Pain prevents me from lifting heavy weights off the floor,but I can manage if the weights are conveniently positioned (e.g. on a table)  4. Walking 2 =  Pain prevents me from walking more than  mile.  5. Sitting 2 =  Pain prevents me from sitting more than 1 hour.  6. Standing 3 =  Pain prevents me from standing more than 1/2 hour.  7. Sleeping 2 =  Even when I take pain medication, I sleep less than 6 hours  8. Social Life 3 =  Pain prevents me from going out very often.  9. Traveling 2 =  My pain restricts my travel over 2 hours.  10. Employment/ Homemaking 2 = I can perform most of my homemaking/job duties, but pain prevents me from performing more physically stressful activities (eg, lifting, vacuuming).  Total 25/50   Interpretation of scores: Score Category  Description  0-20% Minimal Disability The patient can cope with most living activities. Usually no treatment is indicated apart from advice on lifting, sitting and exercise  21-40% Moderate Disability The patient experiences more pain and difficulty with sitting, lifting and standing. Travel and social life are more difficult and they may be disabled from work. Personal care, sexual activity and sleeping are not grossly affected, and the patient can usually be managed by conservative means  41-60% Severe Disability Pain remains the main problem in this group, but activities of daily living are affected. These patients require a detailed investigation  61-80% Crippled Back pain impinges on all aspects of the patient's life. Positive intervention is required  81-100% Bed-bound These patients are either bed-bound or exaggerating their symptoms  Bluford FORBES Zoe DELENA Karon DELENA, et al. Surgery versus conservative management of stable thoracolumbar fracture: the PRESTO feasibility RCT. [place] ([place]): [employer]; 2021 Nov. [facility] Technology Assessment, No. 25.62.) Appendix 3, Oswestry Disability Index category descriptors. Available from: [email or web address].cz  Minimally Clinically Important Difference (MCID) = 12.8%  COGNITION: Overall cognitive status: Within functional limits for tasks assessed       POSTURE: rounded shoulders, forward head, and increased lumbar lordosis  PALPATION: Tenderness at bil upper traps Hypomobility T1-L5 Rib cage tissue restriction   LUMBAR ROM:   AROM eval  Flexion Limited 25 % painful  Extension WFL  Right lateral flexion Limited 50%  Left lateral flexion Limited 50%  Right rotation WFL  Left rotation Limited 25%   (Blank rows = not tested)  LOWER EXTREMITY ROM:     Active  Right eval Left eval  Hip flexion    Hip extension    Hip abduction    Hip adduction    Hip internal rotation    Hip external rotation     Knee flexion    Knee extension    Ankle dorsiflexion    Ankle plantarflexion  Ankle inversion    Ankle eversion     (Blank rows = not tested)  LOWER EXTREMITY MMT:    MMT Right eval Left eval  Hip flexion 4 5  Hip extension 3+ 3+  Hip abduction 4- 4+  Hip adduction    Hip internal rotation 4+ 4-  Hip external rotation 5 5  Knee flexion 3+ 4-  Knee extension 4- 5  Ankle dorsiflexion    Ankle plantarflexion    Ankle inversion    Ankle eversion     (Blank rows = not tested)  LUMBAR SPECIAL TESTS:  Trendelenburg sign: Negative  FUNCTIONAL TESTS:  5 times sit to stand: 26.01 sec  Timed up and go (TUG): 9.40 sec  Tandem stance :  GAIT: Distance walked: 40 ft Assistive device utilized: None Level of assistance: Complete Independence Comments: Tendency to lean to left side during mid stance   TREATMENT DATE:  [facility] Adult PT Treatment:                                                DATE: 04/28/24 Therapeutic Exercise: See HEP                                                                                                                                  PATIENT EDUCATION:  Education details: POC and HEP Person educated: Patient Education method: Explanation, Demonstration, Tactile cues, Verbal cues, and Handouts Education comprehension: verbalized understanding, returned demonstration, verbal cues required, tactile cues required, and needs further education  HOME EXERCISE PROGRAM: Access Code: [email or web address] URL: [email or web address] Date: 04/28/2024 Prepared by: Lavanda Cleverly  Exercises - Corner Pec Major Stretch  - 1 x daily - 7 x weekly - 3 sets - 10 reps - Seated Knee Flexion with Anchored Resistance  - 1 x daily - 7 x weekly - 3 sets - 10 reps - Sidelying Open Book Thoracic Lumbar Rotation and Extension  - 1 x daily - 7 x weekly - 3 sets - 10 reps - Sidelying Open Book Thoracic Lumbar Rotation and Extension  - 1 x daily - 7 x weekly - 3 sets -  10 reps - Seated March with Resistance  - 1 x daily - 7 x weekly - 3 sets - 10 reps  ASSESSMENT:  CLINICAL IMPRESSION: Patient is a 78 y.o. female who was seen today for physical therapy evaluation and treatment for low back pain. Pt reports she has had low back pain for years, however her pain recently worsened back in April 2025 pulling a heavy object in a bent position. She states a few days after that her low back pain she's had for several years traveled up her right side into the mid back, making it difficult to breathe at times. She went to Urgent Care twice since  then, and was told she has muscle spasms in the back. 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 Upon assessment pt presents with LE weakness deficits, limited lumbar ROM, postural dysfunction, thoracic spinal hypomobility, and rib cage tissue restriction. Pt will benefit from skilled therapy to address these deficits and ones below.   OBJECTIVE IMPAIRMENTS: Abnormal gait, decreased activity tolerance, decreased balance, decreased coordination, decreased endurance, decreased mobility, difficulty walking, and decreased ROM.   ACTIVITY LIMITATIONS: carrying, lifting, bending, sitting, standing, squatting, sleeping, transfers, bathing, and dressing  PARTICIPATION LIMITATIONS: meal prep, cleaning, laundry, medication management, shopping, community activity, and yard work  PERSONAL FACTORS: Age, Behavior pattern, Fitness, Past/current experiences, Time since onset of injury/illness/exacerbation, Transportation, and 1-2 comorbidities:   are also affecting patient's functional outcome.   REHAB POTENTIAL: Fair length of time of LBP  CLINICAL DECISION MAKING: Evolving/moderate complexity  EVALUATION COMPLEXITY: Moderate   GOALS: Goals reviewed with patient? Yes  SHORT TERM GOALS: Target date: 05/27/24  Pt will demonstrate efficiency with initial HEP so she  is independent in everyday activities.  Baseline: Goal status: INITIAL  2.  Pt will report at least 2 points lower on pain scale so she increases functional capacity for squatting.  Baseline: 10/10 Goal status: INITIAL  3.  Pt will demonstrate at least 4+/5 MMT in LE so she can walk more than 1/2 mile without pain.  Baseline: 3+ Goal status: INITIAL    LONG TERM GOALS: Target date: 06/23/24  Pt will demonstrate efficiency with advanced HEP so she is independent in everyday activities.  Baseline:  Goal status: INITIAL  2.  Pt will have decreased score on Modified Oswestry no more than 20/50 so she is able to stand longer than 30 min without pain.   Baseline: 25/50 Goal status: INITIAL  3.  Pt will perform  5 Sit to stands in 13 sec or less so she has increased functional capacity for transfers at home.  Baseline: 26.01 sec Goal status: INITIAL    PLAN:  PT FREQUENCY: 2x/week  PT DURATION: 8 weeks  PLANNED INTERVENTIONS: 97164- PT Re-evaluation, 97750- Physical Performance Testing, 97110-Therapeutic exercises, 97530- Therapeutic activity, [id number]- Neuromuscular re-education, 97535- Self Care, [id number]- Manual therapy, [id number]- Gait training, [phone]- Aquatic Therapy, [phone]- Electrical stimulation (manual), [phone] (1-2 muscles), [id number] (3+ muscles)- Dry Needling, Patient/Family education, Balance training, Joint mobilization, Spinal mobilization, DME instructions, Cryotherapy, and Moist heat.  PLAN FOR NEXT SESSION: hip muscle strengthening, core stabilization, thoracic spinal mobility, knee flexion strengthening, low back stretches, dry needling?, soft tissue to mid paraspinals   Lavanda Cleverly, Student-PT 04/28/2024, 4:12 PM

## 2024-04-28 ENCOUNTER — Other Ambulatory Visit: Payer: Self-pay

## 2024-04-28 ENCOUNTER — Ambulatory Visit: Attending: Family Medicine | Admitting: Physical Therapy

## 2024-04-28 ENCOUNTER — Encounter: Payer: Self-pay | Admitting: Physical Therapy

## 2024-04-28 DIAGNOSIS — M62830 Muscle spasm of back: Secondary | ICD-10-CM | POA: Diagnosis not present

## 2024-05-08 DIAGNOSIS — M546 Pain in thoracic spine: Secondary | ICD-10-CM | POA: Diagnosis not present

## 2024-05-09 DIAGNOSIS — R9431 Abnormal electrocardiogram [ECG] [EKG]: Secondary | ICD-10-CM | POA: Diagnosis not present

## 2024-05-14 NOTE — Therapy (Deleted)
 OUTPATIENT PHYSICAL THERAPY THORACOLUMBAR TREATMENT   Patient Name: Melanie Henderson MRN: [id number] DOB:10/10/1945, 78 y.o., female Today's Date: 05/14/2024  END OF SESSION:    Past Medical History:  Diagnosis Date   Chronic back pain greater than 3 months duration    Hypertension    No past surgical history on file. There are no active problems to display for this patient.   PCP: Marca Agreste, DO  REFERRING PROVIDER: Marca Agreste, DO  REFERRING DIAG:  Diagnosis  [phone] (ICD-10-CM) - Back muscle spasm    Rationale for Evaluation and Treatment: Rehabilitation  THERAPY DIAG:  No diagnosis found.  ONSET DATE: Chronic  SUBJECTIVE:                                                                                                                                                                                           SUBJECTIVE STATEMENT: Pt reports she has had low back pain for years, however her pain recently worsened when she was pulling out a turkey from her oven back in April 2025, and a few days after that her low back pain she's had for several years traveled up her right side into the mid back, making it difficult to breathe at times. She went to Urgent Care twice since then, and was told she has muscle spasms in the back. 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 Pt reports her daughter had to wheelchair her into the facility due to extreme pain.   PERTINENT HISTORY:  Chronic LBP  PAIN:  Are you having pain? Yes: NPRS scale: 3/10 currently 10/10 worse Pain location: right side  Pain description: muscle pull, tightening up in the front Aggravating factors: moving, doing too much lifting  Relieving factors: Steroids, muscle relaxants, pain meds   PRECAUTIONS: None  RED FLAGS: None   WEIGHT BEARING RESTRICTIONS: No  FALLS:  Has patient fallen in last 6 months?  No  LIVING ENVIRONMENT: Lives with: lives with their daughter Lives in: Group home Stairs: No Has following equipment at home: Single point cane  OCCUPATION: Retired , was mudlogger   PLOF: Independent  PATIENT GOALS: I want to so I don't get this back because it's so scary, I don't want this to keep happening  NEXT MD VISIT: Nothing schedule  OBJECTIVE:  Note: Objective measures were completed at Evaluation unless otherwise noted.   PATIENT SURVEYS:  Modified Oswestry:  MODIFIED OSWESTRY DISABILITY SCALE  Date: 04/28/24 Score  Pain intensity 4 =  Pain medication provides me with little relief from pain.  2. Personal care (washing, dressing, etc.) 3 =  I need help, but I am able to manage most of my personal care.  3. Lifting 2 = Pain prevents me from lifting heavy weights off the floor,but I can manage if the weights are conveniently positioned (e.g. on a table)  4. Walking 2 =  Pain prevents me from walking more than  mile.  5. Sitting 2 =  Pain prevents me from sitting more than 1 hour.  6. Standing 3 =  Pain prevents me from standing more than 1/2 hour.  7. Sleeping 2 =  Even when I take pain medication, I sleep less than 6 hours  8. Social Life 3 =  Pain prevents me from going out very often.  9. Traveling 2 =  My pain restricts my travel over 2 hours.  10. Employment/ Homemaking 2 = I can perform most of my homemaking/job duties, but pain prevents me from performing more physically stressful activities (eg, lifting, vacuuming).  Total 25/50   Interpretation of scores: Score Category Description  0-20% Minimal Disability The patient can cope with most living activities. Usually no treatment is indicated apart from advice on lifting, sitting and exercise  21-40% Moderate Disability The patient experiences more pain and difficulty with sitting, lifting and standing. Travel and social life are more difficult and they may be disabled from work. Personal care,  sexual activity and sleeping are not grossly affected, and the patient can usually be managed by conservative means  41-60% Severe Disability Pain remains the main problem in this group, but activities of daily living are affected. These patients require a detailed investigation  61-80% Crippled Back pain impinges on all aspects of the patient's life. Positive intervention is required  81-100% Bed-bound These patients are either bed-bound or exaggerating their symptoms  Bluford FORBES Zoe DELENA Karon DELENA, et al. Surgery versus conservative management of stable thoracolumbar fracture: the PRESTO feasibility RCT. [place] ([place]): [employer]; 2021 Nov. [facility] Technology Assessment, No. 25.62.) Appendix 3, Oswestry Disability Index category descriptors. Available from: [email or web address].cz  Minimally Clinically Important Difference (MCID) = 12.8%  COGNITION: Overall cognitive status: Within functional limits for tasks assessed       POSTURE: rounded shoulders, forward head, and increased lumbar lordosis  PALPATION: Tenderness at bil upper traps Hypomobility T1-L5 Rib cage tissue restriction   LUMBAR ROM:   AROM eval  Flexion Limited 25 % painful  Extension WFL  Right lateral flexion Limited 50%  Left lateral flexion Limited 50%  Right rotation WFL  Left rotation Limited 25%   (Blank rows = not tested)  LOWER EXTREMITY ROM:     Active  Right eval Left eval  Hip flexion    Hip extension    Hip abduction    Hip adduction    Hip internal rotation    Hip external rotation    Knee flexion    Knee extension    Ankle dorsiflexion    Ankle plantarflexion    Ankle inversion    Ankle eversion     (Blank rows = not tested)  LOWER EXTREMITY MMT:    MMT Right eval Left eval  Hip flexion 4 5  Hip extension 3+ 3+  Hip abduction 4- 4+  Hip adduction    Hip internal rotation 4+ 4-  Hip external rotation 5 5  Knee flexion 3+ 4-  Knee extension  4- 5  Ankle dorsiflexion    Ankle plantarflexion    Ankle inversion  Ankle eversion     (Blank rows = not tested)  LUMBAR SPECIAL TESTS:  Trendelenburg sign: Negative  FUNCTIONAL TESTS:  5 times sit to stand: 26.01 sec  Timed up and go (TUG): 9.40 sec  Tandem stance :  GAIT: Distance walked: 40 ft Assistive device utilized: None Level of assistance: Complete Independence Comments: Tendency to lean to left side during mid stance   TREATMENT DATE:  [facility] Adult PT Treatment:                                                DATE: 05/19/24 Therapeutic Exercise: *** Manual Therapy: *** Neuromuscular re-ed: *** Therapeutic Activity: *** Modalities: *** Self Care: [facility] Adult PT Treatment:                                                DATE: 04/28/24 Therapeutic Exercise: See HEP                                                                                                                                  PATIENT EDUCATION:  Education details: POC and HEP Person educated: Patient Education method: Explanation, Demonstration, Tactile cues, Verbal cues, and Handouts Education comprehension: verbalized understanding, returned demonstration, verbal cues required, tactile cues required, and needs further education  HOME EXERCISE PROGRAM: Access Code: [email or web address] URL: [email or web address] Date: 04/28/2024 Prepared by: Lavanda Cleverly  Exercises - Corner Pec Major Stretch  - 1 x daily - 7 x weekly - 3 sets - 10 reps - Seated Knee Flexion with Anchored Resistance  - 1 x daily - 7 x weekly - 3 sets - 10 reps - Sidelying Open Book Thoracic Lumbar Rotation and Extension  - 1 x daily - 7 x weekly - 3 sets - 10 reps - Sidelying Open Book Thoracic Lumbar Rotation and Extension  - 1 x daily - 7 x weekly - 3 sets - 10 reps - Seated March with Resistance  - 1 x daily - 7 x weekly - 3 sets - 10 reps  ASSESSMENT:  CLINICAL IMPRESSION:    EVAL: Patient is a 78  y.o. female who was seen today for physical therapy evaluation and treatment for low back pain. Pt reports she has had low back pain for years, however her pain recently worsened back in April 2025 pulling a heavy object in a bent position. She states a few days after that her low back pain she's had for several years traveled up her right side into the mid back, making it difficult to breathe at times. She went to Urgent Care twice since then, and was told she has muscle spasms in the back.  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 Upon assessment pt presents with LE weakness deficits, limited lumbar ROM, postural dysfunction, thoracic spinal hypomobility, and rib cage tissue restriction. Pt will benefit from skilled therapy to address these deficits and ones below.   OBJECTIVE IMPAIRMENTS: Abnormal gait, decreased activity tolerance, decreased balance, decreased coordination, decreased endurance, decreased mobility, difficulty walking, and decreased ROM.   ACTIVITY LIMITATIONS: carrying, lifting, bending, sitting, standing, squatting, sleeping, transfers, bathing, and dressing  PARTICIPATION LIMITATIONS: meal prep, cleaning, laundry, medication management, shopping, community activity, and yard work  PERSONAL FACTORS: Age, Behavior pattern, Fitness, Past/current experiences, Time since onset of injury/illness/exacerbation, Transportation, and 1-2 comorbidities:   are also affecting patient's functional outcome.   REHAB POTENTIAL: Fair length of time of LBP  CLINICAL DECISION MAKING: Evolving/moderate complexity  EVALUATION COMPLEXITY: Moderate   GOALS: Goals reviewed with patient? Yes  SHORT TERM GOALS: Target date: 05/27/24  Pt will demonstrate efficiency with initial HEP so she is independent in everyday activities.  Baseline: Goal status: INITIAL  2.  Pt will report at least 2 points lower on pain scale so she  increases functional capacity for squatting.  Baseline: 10/10 Goal status: INITIAL  3.  Pt will demonstrate at least 4+/5 MMT in LE so she can walk more than 1/2 mile without pain.  Baseline: 3+ Goal status: INITIAL    LONG TERM GOALS: Target date: 06/23/24  Pt will demonstrate efficiency with advanced HEP so she is independent in everyday activities.  Baseline:  Goal status: INITIAL  2.  Pt will have decreased score on Modified Oswestry no more than 20/50 so she is able to stand longer than 30 min without pain.   Baseline: 25/50 Goal status: INITIAL  3.  Pt will perform  5 Sit to stands in 13 sec or less so she has increased functional capacity for transfers at home.  Baseline: 26.01 sec Goal status: INITIAL    PLAN:  PT FREQUENCY: 2x/week  PT DURATION: 8 weeks  PLANNED INTERVENTIONS: 97164- PT Re-evaluation, 97750- Physical Performance Testing, 97110-Therapeutic exercises, 97530- Therapeutic activity, [id number]- Neuromuscular re-education, 97535- Self Care, 02859- Manual therapy, [id number]- Gait training, [phone]- Aquatic Therapy, [phone]- Electrical stimulation (manual), [phone] (1-2 muscles), 20561 (3+ muscles)- Dry Needling, Patient/Family education, Balance training, Joint mobilization, Spinal mobilization, DME instructions, Cryotherapy, and Moist heat.  PLAN FOR NEXT SESSION: hip muscle strengthening, core stabilization, thoracic spinal mobility, knee flexion strengthening, low back stretches, dry needling?, soft tissue to mid paraspinals

## 2024-05-19 ENCOUNTER — Ambulatory Visit: Admitting: Physical Therapy

## 2024-05-25 ENCOUNTER — Ambulatory Visit: Attending: Family Medicine | Admitting: Physical Therapy

## 2024-05-25 ENCOUNTER — Encounter: Payer: Self-pay | Admitting: Physical Therapy

## 2024-05-25 DIAGNOSIS — M62830 Muscle spasm of back: Secondary | ICD-10-CM | POA: Diagnosis present

## 2024-05-25 NOTE — Therapy (Signed)
 OUTPATIENT PHYSICAL THERAPY THORACOLUMBAR TREATMENT   Patient Name: Melanie Henderson MRN: [id number] DOB:[birth year], 78 y.o., female Today's Date: 05/25/2024  END OF SESSION:  PT End of Session - 05/25/24 1224     Visit Number 2    Number of Visits 17    Date for Recertification  06/23/24    Authorization Type [employer]    Authorization - Visit Number 2    Progress Note Due on Visit 10    PT Start Time 1145    PT Stop Time 1227    PT Time Calculation (min) 42 min    Activity Tolerance Patient tolerated treatment well    Behavior During Therapy WFL for tasks assessed/performed           Past Medical History:  Diagnosis Date   Chronic back pain greater than 3 months duration    Hypertension    History reviewed. No pertinent surgical history. There are no active problems to display for this patient.   PCP: Marca Agreste, DO  REFERRING PROVIDER: Marca Agreste, DO  REFERRING DIAG:  Diagnosis  [phone] (ICD-10-CM) - Back muscle spasm    Rationale for Evaluation and Treatment: Rehabilitation  THERAPY DIAG:  Muscle spasm of back  ONSET DATE: Chronic  SUBJECTIVE:                                                                                                                                                                                           SUBJECTIVE STATEMENT: Pt states she overdid it during thanksgiving and had to go to urgent care. She got an injection at urgent care which really helped. She is still on muscle relaxer medication. She states she had a spasm in her Lt shoulder blade area this morning but she took medication and it feels a little better. She states she has increased pain usually with lifting and twisting or when carrying purse on Lt side PERTINENT HISTORY:  Chronic LBP   Pt reports she has had low back pain for years, however her pain recently worsened when she was pulling out a turkey from her oven back in April 2025,  and a few days after that her low back pain she's had for several years traveled up her right side into the mid back, making it difficult to breathe at times. She went to Urgent Care twice since then, and was told she has muscle spasms in the back. 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 Pt reports her daughter had to wheelchair her into the facility due to  extreme pain.   PAIN:  Are you having pain? Yes: NPRS scale: 3/10 currently 10/10 worse Pain location: right side  Pain description: muscle pull, tightening up in the front Aggravating factors: moving, doing too much lifting  Relieving factors: Steroids, muscle relaxants, pain meds   PRECAUTIONS: None  RED FLAGS: None   WEIGHT BEARING RESTRICTIONS: No  FALLS:  Has patient fallen in last 6 months? No  LIVING ENVIRONMENT: Lives with: lives with their daughter Lives in: Group home Stairs: No Has following equipment at home: Single point cane  OCCUPATION: Retired , was mudlogger   PLOF: Independent  PATIENT GOALS: I want to so I don't get this back because it's so scary, I don't want this to keep happening  NEXT MD VISIT: Nothing schedule  OBJECTIVE:  Note: Objective measures were completed at Evaluation unless otherwise noted.   PATIENT SURVEYS:  Modified Oswestry:  MODIFIED OSWESTRY DISABILITY SCALE  Date: 04/28/24 Score  Pain intensity 4 =  Pain medication provides me with little relief from pain.  2. Personal care (washing, dressing, etc.) 3 =  I need help, but I am able to manage most of my personal care.  3. Lifting 2 = Pain prevents me from lifting heavy weights off the floor,but I can manage if the weights are conveniently positioned (e.g. on a table)  4. Walking 2 =  Pain prevents me from walking more than  mile.  5. Sitting 2 =  Pain prevents me from sitting more than 1 hour.  6. Standing 3 =  Pain prevents me from  standing more than 1/2 hour.  7. Sleeping 2 =  Even when I take pain medication, I sleep less than 6 hours  8. Social Life 3 =  Pain prevents me from going out very often.  9. Traveling 2 =  My pain restricts my travel over 2 hours.  10. Employment/ Homemaking 2 = I can perform most of my homemaking/job duties, but pain prevents me from performing more physically stressful activities (eg, lifting, vacuuming).  Total 25/50   Interpretation of scores: Score Category Description  0-20% Minimal Disability The patient can cope with most living activities. Usually no treatment is indicated apart from advice on lifting, sitting and exercise  21-40% Moderate Disability The patient experiences more pain and difficulty with sitting, lifting and standing. Travel and social life are more difficult and they may be disabled from work. Personal care, sexual activity and sleeping are not grossly affected, and the patient can usually be managed by conservative means  41-60% Severe Disability Pain remains the main problem in this group, but activities of daily living are affected. These patients require a detailed investigation  61-80% Crippled Back pain impinges on all aspects of the patients life. Positive intervention is required  81-100% Bed-bound These patients are either bed-bound or exaggerating their symptoms  Bluford FORBES Zoe DELENA Karon DELENA, et al. Surgery versus conservative management of stable thoracolumbar fracture: the PRESTO feasibility RCT. [place] ([place]): [employer]; 2021 Nov. [facility] Technology Assessment, No. 25.62.) Appendix 3, Oswestry Disability Index category descriptors. Available from: [email or web address].cz  Minimally Clinically Important Difference (MCID) = 12.8%  COGNITION: Overall cognitive status: Within functional limits for tasks assessed       POSTURE: rounded shoulders, forward head, and increased lumbar lordosis  PALPATION: Tenderness at  bil upper traps Hypomobility T1-L5 Rib cage tissue restriction   LUMBAR ROM:   AROM eval  Flexion Limited 25 % painful  Extension [facility]  Right lateral flexion Limited 50%  Left lateral flexion Limited 50%  Right rotation WFL  Left rotation Limited 25%   (Blank rows = not tested)  LOWER EXTREMITY ROM:     Active  Right eval Left eval  Hip flexion    Hip extension    Hip abduction    Hip adduction    Hip internal rotation    Hip external rotation    Knee flexion    Knee extension    Ankle dorsiflexion    Ankle plantarflexion    Ankle inversion    Ankle eversion     (Blank rows = not tested)  LOWER EXTREMITY MMT:    MMT Right eval Left eval  Hip flexion 4 5  Hip extension 3+ 3+  Hip abduction 4- 4+  Hip adduction    Hip internal rotation 4+ 4-  Hip external rotation 5 5  Knee flexion 3+ 4-  Knee extension 4- 5  Ankle dorsiflexion    Ankle plantarflexion    Ankle inversion    Ankle eversion     (Blank rows = not tested)  LUMBAR SPECIAL TESTS:  Trendelenburg sign: Negative  FUNCTIONAL TESTS:  5 times sit to stand: 26.01 sec  Timed up and go (TUG): 9.40 sec  Tandem stance :  GAIT: Distance walked: 40 ft Assistive device utilized: None Level of assistance: Complete Independence Comments: Tendency to lean to left side during mid stance   TREATMENT DATE:  [facility] Adult PT Treatment:                                                DATE: 05/25/24  Neuromuscular re-ed: Scap squeeze x 10 with cues for technique to reduce compensation Seated pelvic tilt x 10 Therapeutic Activity: Seated thoracic rotation - difficult! Seated physioball roll outs for lumbar mobility Shoulder ext green TB x 12 Bow and arrow green TB x 10 bilat Sit <> stand without UE support 2 x 5  Modalities: TENS Lt peri scapular area x 10 min to tolerance Moist heat x 10 min Lt shoulder  [facility] Adult PT Treatment:                                                DATE: 04/28/24 Therapeutic  Exercise: See HEP                                                                                                                                  PATIENT EDUCATION:  Education details: POC and HEP Person educated: Patient Education method: Explanation, Demonstration, Tactile cues, Verbal cues, and Handouts Education comprehension: verbalized understanding, returned demonstration, verbal cues required, tactile cues  required, and needs further education  HOME EXERCISE PROGRAM: Access Code: [email or web address] URL: [email or web address] Date: 05/25/2024 Prepared by: Darice Conine  Exercises - Corner Pec Major Stretch  - 1 x daily - 7 x weekly - 3 sets - 10 reps - Seated Thoracic Flexion and Rotation with Swiss Ball  - 1 x daily - 7 x weekly - 1 sets - 10 reps - 5 seconds hold - Seated Scapular Retraction  - 1 x daily - 7 x weekly - 3 sets - 10 reps - Sit to Stand Without Arm Support  - 1 x daily - 7 x weekly - 3 sets - 5 reps  ASSESSMENT:  CLINICAL IMPRESSION: Pt continues with c/o muscle spasms in Lt periscapular area, sometimes going for low back to upper back. Session focused on improving mobility as well as improving muscle recruitment during postural strengthening. Pt requests to try TENs at end of session as her daughter has a unit she may be able to use. Pt with good tolerance to exercise and modalities. HEP updated     GOALS: Goals reviewed with patient? Yes  SHORT TERM GOALS: Target date: 05/27/24  Pt will demonstrate efficiency with initial HEP so she is independent in everyday activities.  Baseline: Goal status: INITIAL  2.  Pt will report at least 2 points lower on pain scale so she increases functional capacity for squatting.  Baseline: 10/10 Goal status: INITIAL  3.  Pt will demonstrate at least 4+/5 MMT in LE so she can walk more than 1/2 mile without pain.  Baseline: 3+ Goal status: INITIAL    LONG TERM GOALS: Target date: 06/23/24  Pt will  demonstrate efficiency with advanced HEP so she is independent in everyday activities.  Baseline:  Goal status: INITIAL  2.  Pt will have decreased score on Modified Oswestry no more than 20/50 so she is able to stand longer than 30 min without pain.   Baseline: 25/50 Goal status: INITIAL  3.  Pt will perform  5 Sit to stands in 13 sec or less so she has increased functional capacity for transfers at home.  Baseline: 26.01 sec Goal status: INITIAL    PLAN:  PT FREQUENCY: 2x/week  PT DURATION: 8 weeks  PLANNED INTERVENTIONS: 97164- PT Re-evaluation, 97750- Physical Performance Testing, 97110-Therapeutic exercises, 97530- Therapeutic activity, [id number]- Neuromuscular re-education, 97535- Self Care, [id number]- Manual therapy, [id number]- Gait training, [phone]- Aquatic Therapy, [phone]- Electrical stimulation (manual), [phone] (1-2 muscles), [id number] (3+ muscles)- Dry Needling, Patient/Family education, Balance training, Joint mobilization, Spinal mobilization, DME instructions, Cryotherapy, and Moist heat.  PLAN FOR NEXT SESSION: CHECK STG! hip muscle strengthening, core stabilization, thoracic spinal mobility, knee flexion strengthening, low back stretches, dry needling?, soft tissue to mid paraspinals   Ekaterina Denise, PT 05/25/2024, 12:25 PM

## 2024-06-09 ENCOUNTER — Ambulatory Visit

## 2024-06-18 ENCOUNTER — Encounter: Payer: Self-pay | Admitting: Physical Therapy

## 2024-06-18 ENCOUNTER — Ambulatory Visit: Attending: Family Medicine | Admitting: Physical Therapy

## 2024-06-18 DIAGNOSIS — M62830 Muscle spasm of back: Secondary | ICD-10-CM | POA: Diagnosis present

## 2024-06-18 NOTE — Therapy (Signed)
 " OUTPATIENT PHYSICAL THERAPY THORACOLUMBAR TREATMENT   Patient Name: Melanie Henderson MRN: [id number] DOB:Oct 18, 1945, 79 y.o., female Today's Date: 06/18/2024  END OF SESSION:  PT End of Session - 06/18/24 0841     Visit Number 3    Number of Visits 17    Date for Recertification  06/23/24    Authorization Type [employer]    Authorization - Visit Number 3    Progress Note Due on Visit 10    PT Start Time 0800    PT Stop Time 0840    PT Time Calculation (min) 40 min    Activity Tolerance Patient tolerated treatment well    Behavior During Therapy WFL for tasks assessed/performed            Past Medical History:  Diagnosis Date   Chronic back pain greater than 3 months duration    Hypertension    History reviewed. No pertinent surgical history. There are no active problems to display for this patient.   PCP: Marca Agreste, DO  REFERRING PROVIDER: Marca Agreste, DO  REFERRING DIAG:  Diagnosis  [phone] (ICD-10-CM) - Back muscle spasm    Rationale for Evaluation and Treatment: Rehabilitation  THERAPY DIAG:  Muscle spasm of back  ONSET DATE: Chronic  SUBJECTIVE:                                                                                                                                                                                           SUBJECTIVE STATEMENT: Pt states she had some bad spasms a couple weeks ago and she had to take prednisone . The meds helped and she is feeling better. PERTINENT HISTORY:  Chronic LBP   Pt reports she has had low back pain for years, however her pain recently worsened when she was pulling out a turkey from her oven back in April 2025, and a few days after that her low back pain she's had for several years traveled up her right side into the mid back, making it difficult to breathe at times. She went to Urgent Care twice since then, and was told she has muscle spasms in the back. 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 Pt reports her daughter had to wheelchair her into the facility due to extreme pain.   PAIN:  Are you having pain? Yes: NPRS scale: 2/10 currently 10/10 worse Pain location: right side  Pain description: muscle pull, tightening up in the front Aggravating factors: moving, doing too much lifting  Relieving factors: Steroids, muscle relaxants, pain meds  PRECAUTIONS: None  RED FLAGS: None   WEIGHT BEARING RESTRICTIONS: No  FALLS:  Has patient fallen in last 6 months? No  LIVING ENVIRONMENT: Lives with: lives with their daughter Lives in: Group home Stairs: No Has following equipment at home: Single point cane  OCCUPATION: Retired , was mudlogger   PLOF: Independent  PATIENT GOALS: I want to so I don't get this back because it's so scary, I don't want this to keep happening  NEXT MD VISIT: Nothing schedule  OBJECTIVE:  Note: Objective measures were completed at Evaluation unless otherwise noted.   PATIENT SURVEYS:  Modified Oswestry:  MODIFIED OSWESTRY DISABILITY SCALE  Date: 04/28/24 Score  Pain intensity 4 =  Pain medication provides me with little relief from pain.  2. Personal care (washing, dressing, etc.) 3 =  I need help, but I am able to manage most of my personal care.  3. Lifting 2 = Pain prevents me from lifting heavy weights off the floor,but I can manage if the weights are conveniently positioned (e.g. on a table)  4. Walking 2 =  Pain prevents me from walking more than  mile.  5. Sitting 2 =  Pain prevents me from sitting more than 1 hour.  6. Standing 3 =  Pain prevents me from standing more than 1/2 hour.  7. Sleeping 2 =  Even when I take pain medication, I sleep less than 6 hours  8. Social Life 3 =  Pain prevents me from going out very often.  9. Traveling 2 =  My pain restricts my travel over 2 hours.  10. Employment/ Homemaking 2 = I can  perform most of my homemaking/job duties, but pain prevents me from performing more physically stressful activities (eg, lifting, vacuuming).  Total 25/50   Interpretation of scores: Score Category Description  0-20% Minimal Disability The patient can cope with most living activities. Usually no treatment is indicated apart from advice on lifting, sitting and exercise  21-40% Moderate Disability The patient experiences more pain and difficulty with sitting, lifting and standing. Travel and social life are more difficult and they may be disabled from work. Personal care, sexual activity and sleeping are not grossly affected, and the patient can usually be managed by conservative means  41-60% Severe Disability Pain remains the main problem in this group, but activities of daily living are affected. These patients require a detailed investigation  61-80% Crippled Back pain impinges on all aspects of the patients life. Positive intervention is required  81-100% Bed-bound These patients are either bed-bound or exaggerating their symptoms  Bluford FORBES Zoe DELENA Karon DELENA, et al. Surgery versus conservative management of stable thoracolumbar fracture: the PRESTO feasibility RCT. [place] ([place]): [employer]; 2021 Nov. [facility] Technology Assessment, No. 25.62.) Appendix 3, Oswestry Disability Index category descriptors. Available from: Findjewelers.cz  Minimally Clinically Important Difference (MCID) = 12.8%  COGNITION: Overall cognitive status: Within functional limits for tasks assessed       POSTURE: rounded shoulders, forward head, and increased lumbar lordosis  PALPATION: Tenderness at bil upper traps Hypomobility T1-L5 Rib cage tissue restriction   LUMBAR ROM:   AROM eval  Flexion Limited 25 % painful  Extension WFL  Right lateral flexion Limited 50%  Left lateral flexion Limited 50%  Right rotation WFL  Left rotation Limited 25%   (Blank  rows = not tested)  LOWER EXTREMITY ROM:     Active  Right eval Left eval  Hip flexion    Hip  extension    Hip abduction    Hip adduction    Hip internal rotation    Hip external rotation    Knee flexion    Knee extension    Ankle dorsiflexion    Ankle plantarflexion    Ankle inversion    Ankle eversion     (Blank rows = not tested)  LOWER EXTREMITY MMT:    MMT Right eval Left eval Rt/Lt 06/18/24  Hip flexion 4 5 4/4+  Hip extension 3+ 3+   Hip abduction 4- 4+ 4/4+  Hip adduction     Hip internal rotation 4+ 4-   Hip external rotation 5 5   Knee flexion 3+ 4- 4-/4-  Knee extension 4- 5 4+/5  Ankle dorsiflexion     Ankle plantarflexion     Ankle inversion     Ankle eversion      (Blank rows = not tested)  LUMBAR SPECIAL TESTS:  Trendelenburg sign: Negative  FUNCTIONAL TESTS:  5 times sit to stand: 26.01 sec  Timed up and go (TUG): 9.40 sec  Tandem stance :  GAIT: Distance walked: 40 ft Assistive device utilized: None Level of assistance: Complete Independence Comments: Tendency to lean to left side during mid stance   TREATMENT DATE:  [facility] Adult PT Treatment:                                                DATE: 06/18/24 Therapeutic Exercise: MMT (see above) Piriformis stretch 2 x 30 sec bilat  Neuromuscular re-ed: Scap squeeze x 10 with cues for technique to reduce compensation Ab isometric with ball x 10 Therapeutic Activity: Row green TB x 15 Shld ext green TB x 15 Bow and arrow green TB x 10 bilat Thoracic rotation at wall Sit <> stand 2 x 5   [facility] Adult PT Treatment:                                                DATE: 05/25/24  Neuromuscular re-ed: Scap squeeze x 10 with cues for technique to reduce compensation Seated pelvic tilt x 10 Therapeutic Activity: Seated thoracic rotation - difficult! Seated physioball roll outs for lumbar mobility Shoulder ext green TB x 12 Bow and arrow green TB x 10 bilat Sit <> stand without UE support 2 x  5  Modalities: TENS Lt peri scapular area x 10 min to tolerance Moist heat x 10 min Lt shoulder                                                                                                                                   PATIENT EDUCATION:  Education details:  POC and HEP Person educated: Patient Education method: Explanation, Demonstration, Tactile cues, Verbal cues, and Handouts Education comprehension: verbalized understanding, returned demonstration, verbal cues required, tactile cues required, and needs further education  HOME EXERCISE PROGRAM: Access Code: [email or web address] URL: [email or web address] Date: 06/18/2024 Prepared by: Darice Conine  Exercises - Seated Scapular Retraction  - 1 x daily - 7 x weekly - 3 sets - 10 reps - Sit to Stand Without Arm Support  - 1 x daily - 7 x weekly - 3 sets - 5 reps - Drawing Bow  - 1 x daily - 7 x weekly - 2 sets - 10 reps - Standing Shoulder Row with Anchored Resistance  - 1 x daily - 7 x weekly - 2 sets - 10 reps - Standing with Forearms Thoracic Rotation  - 1 x daily - 7 x weekly - 2 sets - 10 reps - Seated Figure 4 Piriformis Stretch  - 1 x daily - 7 x weekly - 1 sets - 3 reps - 20-30sec hold  ASSESSMENT:  CLINICAL IMPRESSION: Pt has made slight improvements in muscle strength despite only attending therapy 2x in 8 weeks. She continues with c/o muscle spasms and PT educated pt on importance of consistency with exercise to get maximum results from treatment. Pt states she will be out of town for a few weeks and hopes to resume more consistent therapy when she returns to town. HEP updated for pt to perform while out of town     GOALS: Goals reviewed with patient? Yes  SHORT TERM GOALS: Target date: 05/27/24  Pt will demonstrate efficiency with initial HEP so she is independent in everyday activities.  Baseline: Goal status: MET  2.  Pt will report at least 2 points lower on pain scale so she increases functional  capacity for squatting.  Baseline: 10/10 Goal status: IN PROGRESS  3.  Pt will demonstrate at least 4+/5 MMT in LE so she can walk more than 1/2 mile without pain.  Baseline: 3+ Goal status: IN PROGRESS    LONG TERM GOALS: Target date: 06/23/24  Pt will demonstrate efficiency with advanced HEP so she is independent in everyday activities.  Baseline:  Goal status: INITIAL  2.  Pt will have decreased score on Modified Oswestry no more than 20/50 so she is able to stand longer than 30 min without pain.   Baseline: 25/50 Goal status: INITIAL  3.  Pt will perform  5 Sit to stands in 13 sec or less so she has increased functional capacity for transfers at home.  Baseline: 26.01 sec Goal status: INITIAL    PLAN:  PT FREQUENCY: 2x/week  PT DURATION: 8 weeks  PLANNED INTERVENTIONS: 97164- PT Re-evaluation, 97750- Physical Performance Testing, 97110-Therapeutic exercises, 97530- Therapeutic activity, [id number]- Neuromuscular re-education, 97535- Self Care, 02859- Manual therapy, [id number]- Gait training, [phone]- Aquatic Therapy, [phone]- Electrical stimulation (manual), [phone] (1-2 muscles), [id number] (3+ muscles)- Dry Needling, Patient/Family education, Balance training, Joint mobilization, Spinal mobilization, DME instructions, Cryotherapy, and Moist heat.  PLAN FOR NEXT SESSION: postural and core strength, thoracic mobility   Chasyn Cinque, PT 06/18/2024, 8:41 AM  "
